# Patient Record
Sex: Female | Born: 1970 | Race: White | Hispanic: No | Marital: Married | Smoking: Never smoker
Health system: Southern US, Community
[De-identification: ages and names within clinical notes are randomized; demographics above are authoritative.]

## PROBLEM LIST (undated history)

## (undated) DIAGNOSIS — N200 Calculus of kidney: Secondary | ICD-10-CM

## (undated) DIAGNOSIS — B019 Varicella without complication: Secondary | ICD-10-CM

## (undated) HISTORY — DX: Calculus of kidney: N20.0

## (undated) HISTORY — PX: ABDOMINAL HYSTERECTOMY: SHX81

## (undated) HISTORY — DX: Varicella without complication: B01.9

---

## 2004-06-05 ENCOUNTER — Ambulatory Visit: Payer: Self-pay | Admitting: Unknown Physician Specialty

## 2004-06-14 ENCOUNTER — Ambulatory Visit: Payer: Self-pay | Admitting: Unknown Physician Specialty

## 2005-06-25 ENCOUNTER — Ambulatory Visit: Payer: Self-pay | Admitting: Unknown Physician Specialty

## 2006-07-15 ENCOUNTER — Ambulatory Visit: Payer: Self-pay | Admitting: Unknown Physician Specialty

## 2008-01-06 ENCOUNTER — Ambulatory Visit: Payer: Self-pay | Admitting: Urology

## 2008-05-03 ENCOUNTER — Ambulatory Visit: Payer: Self-pay | Admitting: Unknown Physician Specialty

## 2008-05-04 ENCOUNTER — Inpatient Hospital Stay: Payer: Self-pay | Admitting: Unknown Physician Specialty

## 2008-07-18 ENCOUNTER — Ambulatory Visit: Payer: Self-pay | Admitting: Unknown Physician Specialty

## 2009-08-30 ENCOUNTER — Ambulatory Visit: Payer: Self-pay | Admitting: Unknown Physician Specialty

## 2010-08-20 ENCOUNTER — Ambulatory Visit: Payer: Self-pay

## 2010-08-29 ENCOUNTER — Inpatient Hospital Stay: Payer: Self-pay

## 2010-08-30 LAB — PATHOLOGY REPORT

## 2010-10-08 ENCOUNTER — Ambulatory Visit: Payer: Self-pay | Admitting: Unknown Physician Specialty

## 2012-03-16 ENCOUNTER — Ambulatory Visit: Payer: Self-pay | Admitting: Family Medicine

## 2012-03-23 ENCOUNTER — Emergency Department: Payer: Self-pay | Admitting: Emergency Medicine

## 2012-03-23 LAB — URINALYSIS, COMPLETE
Bacteria: NONE SEEN
Leukocyte Esterase: NEGATIVE
Nitrite: NEGATIVE
Ph: 6 (ref 4.5–8.0)
Protein: NEGATIVE
RBC,UR: 5 /HPF (ref 0–5)
Squamous Epithelial: 4
WBC UR: NONE SEEN /HPF (ref 0–5)

## 2012-03-23 LAB — CBC
MCH: 32.6 pg (ref 26.0–34.0)
MCV: 92 fL (ref 80–100)
Platelet: 181 10*3/uL (ref 150–440)
RDW: 12.7 % (ref 11.5–14.5)
WBC: 8.3 10*3/uL (ref 3.6–11.0)

## 2012-03-23 LAB — BASIC METABOLIC PANEL
Calcium, Total: 9.1 mg/dL (ref 8.5–10.1)
Chloride: 107 mmol/L (ref 98–107)
Co2: 27 mmol/L (ref 21–32)
Creatinine: 0.7 mg/dL (ref 0.60–1.30)
EGFR (African American): >60
EGFR (Non-African Amer.): >60
Glucose: 93 mg/dL (ref 65–99)
Osmolality: 278 (ref 275–301)
Sodium: 140 mmol/L (ref 136–145)

## 2016-04-09 ENCOUNTER — Encounter: Payer: Self-pay | Admitting: Family

## 2016-04-09 ENCOUNTER — Ambulatory Visit (INDEPENDENT_AMBULATORY_CARE_PROVIDER_SITE_OTHER): Payer: 59 | Admitting: Family

## 2016-04-09 VITALS — BP 116/72 | HR 69 | Temp 98.4°F | Ht 63.5 in | Wt 218.4 lb

## 2016-04-09 DIAGNOSIS — Z0000 Encounter for general adult medical examination without abnormal findings: Secondary | ICD-10-CM | POA: Insufficient documentation

## 2016-04-09 DIAGNOSIS — Z7689 Persons encountering health services in other specified circumstances: Secondary | ICD-10-CM

## 2016-04-09 NOTE — Assessment & Plan Note (Addendum)
Reviewed past medical and social, family history with patient. We discussed her recent lab work which showed elevated cholesterol and LDL. Her risk is less than 7.5% for the next 10 years and we discussed the importance of lifestyle modifications. Diet information given including low glycemic index and dr. Lupita Dawn diet plan. Encouraged to use Weight Watchers at work. Patient will follow-up with CPE. Ordered TSH, vitamin D as it as those labs were not included with wellness labs by employer.

## 2016-04-09 NOTE — Progress Notes (Signed)
Pre visit review using our clinic review tool, if applicable. No additional management support is needed unless otherwise documented below in the visit note. 

## 2016-04-09 NOTE — Patient Instructions (Addendum)
Your cholesterol and triglycerides have increased since last check. Your risk for cardiovascular disease is < 7.5% over the next 10 years so we won't start medication therapy quite yet. Please follow low trans fat and low saturated fat diet. Maintain exercise 30 minutes per day , 5 times per week and we should be able to turn this around. We will follow this and discuss medication at your next visit.   Pleasure meeting you.    High Cholesterol High cholesterol refers to having a high level of cholesterol in your blood. Cholesterol is a white, waxy, fat-like protein that your body needs in small amounts. Your liver makes all the cholesterol you need. Excess cholesterol comes from the food you eat. Cholesterol travels in your bloodstream through your blood vessels. If you have high cholesterol, deposits (plaque) may build up on the walls of your blood vessels. This makes the arteries narrower and stiffer. Plaque increases your risk of heart attack and stroke. Work with your health care provider to keep your cholesterol levels in a healthy range. RISK FACTORS Several things can make you more likely to have high cholesterol. These include:   Eating foods high in animal fat (saturated fat) or cholesterol.  Being overweight.  Not getting enough exercise.  Having a family history of high cholesterol. SIGNS AND SYMPTOMS High cholesterol does not cause symptoms. DIAGNOSIS  Your health care provider can do a blood test to check whether you have high cholesterol. If you are older than 20, your health care provider may check your cholesterol every 4-6 years. You may be checked more often if you already have high cholesterol or other risk factors for heart disease. The blood test for cholesterol measures the following:  Bad cholesterol (LDL cholesterol). This is the type of cholesterol that causes heart disease. This number should be less than 100.  Good cholesterol (HDL cholesterol). This type helps  protect against heart disease. A healthy level of HDL cholesterol is 60 or higher.  Total cholesterol. This is the combined number of LDL cholesterol and HDL cholesterol. A healthy number is less than 200. TREATMENT  High cholesterol can be treated with diet changes, lifestyle changes, and medicine.   Diet changes may include eating more whole grains, fruits, vegetables, nuts, and fish. You may also have to cut back on red meat and foods with a lot of added sugar.  Lifestyle changes may include getting at least 40 minutes of aerobic exercise three times a week. Aerobic exercises include walking, biking, and swimming. Aerobic exercise along with a healthy diet can help you maintain a healthy weight. Lifestyle changes may also include quitting smoking.  If diet and lifestyle changes are not enough to lower your cholesterol, your health care provider may prescribe a statin medicine. This medicine has been shown to lower cholesterol and also lower the risk of heart disease. HOME CARE INSTRUCTIONS  Only take over-the-counter or prescription medicines as directed by your health care provider.   Follow a healthy diet as directed by your health care provider. For instance:   Eat chicken (without skin), fish, veal, shellfish, ground [place] breast, and round or loin cuts of red meat.  Do not eat fried foods and fatty meats, such as hot dogs and salami.   Eat plenty of fruits, such as apples.   Eat plenty of vegetables, such as broccoli, potatoes, and carrots.   Eat beans, peas, and lentils.   Eat grains, such as barley, rice, couscous, and bulgur wheat.  Eat pasta without cream sauces.   Use skim or nonfat milk and low-fat or nonfat yogurt and cheeses. Do not eat or drink whole milk, cream, ice cream, egg yolks, and hard cheeses.   Do not eat stick margarine or tub margarines that contain trans fats (also called partially hydrogenated oils).   Do not eat cakes, cookies, crackers,  or other baked goods that contain trans fats.   Do not eat saturated tropical oils, such as coconut and palm oil.   Exercise as directed by your health care provider. Increase your activity level with activities such as gardening or walking.   Keep all follow-up appointments.  SEEK MEDICAL CARE IF:  You are struggling to maintain a healthy diet or weight.  You need help starting an exercise program.  You need help to stop smoking. SEEK IMMEDIATE MEDICAL CARE IF:  You have chest pain.  You have trouble breathing.   This information is not intended to replace advice given to you by your health care provider. Make sure you discuss any questions you have with your health care provider.   Document Released: 06/23/2005 Document Revised: 07/14/2014 Document Reviewed: 04/15/2013 Elsevier Interactive Patient Education [employer].

## 2016-04-09 NOTE — Progress Notes (Signed)
Subjective:    Patient ID: Tracy Jacobs, female    DOB: August 04, 1970, 45 y.o.   MRN: [id number]  CC: JEZLYN BEUCLER is a 45 y.o. female who presents todayTo establish care.  HPI: Patient here today to establish care as a new patient to our practice. Prior care had been with [facility] in [place].  Her prior OB/GYN care had been at [facility]. Requesting those records.  Feeling well today no complaints.  Patient has a sister who passed away from breast cancer diagnosed at age 46. We discussed implications for her young daughters and advised her to discuss with her providers if they should start mammogram due to a maternal aunt in their 63s as well.        HISTORY:  Past Medical History:  Diagnosis Date  . Chicken pox   . Kidney stones    Past Surgical History:  Procedure Laterality Date  . ABDOMINAL HYSTERECTOMY    . CESAREAN SECTION     3 times   Family History  Problem Relation Age of Onset  . Breast cancer Sister 99  . Alcohol abuse Mother   . Alcohol abuse Father     Allergies: Review of patient's allergies indicates not on file. No current outpatient prescriptions on file prior to visit.   No current facility-administered medications on file prior to visit.     Social History  Substance Use Topics  . Smoking status: Never Smoker  . Smokeless tobacco: Never Used  . Alcohol use No    Review of Systems  Constitutional: Negative for chills, fever and unexpected weight change.  HENT: Negative for congestion.   Respiratory: Negative for cough.   Cardiovascular: Negative for chest pain, palpitations and leg swelling.  Gastrointestinal: Negative for nausea and vomiting.  Musculoskeletal: Negative for arthralgias and myalgias.  Skin: Negative for rash.  Neurological: Negative for headaches.  Hematological: Negative for adenopathy.  Psychiatric/Behavioral: Negative for confusion.      Objective:    BP 116/72   Pulse 69   Temp 98.4 F (36.9 C)  (Oral)   Ht 5' 3.5" (1.613 m)   Wt 218 lb 6.4 oz (99.1 kg)   SpO2 97%   BMI 38.08 kg/m  BP Readings from Last 3 Encounters:  04/09/16 116/72   Wt Readings from Last 3 Encounters:  04/09/16 218 lb 6.4 oz (99.1 kg)    Physical Exam  Constitutional: She appears well-developed and well-nourished.  Eyes: Conjunctivae are normal.  Neck: No thyroid mass and no thyromegaly present.  Cardiovascular: Normal rate, regular rhythm, normal heart sounds and normal pulses.   Pulmonary/Chest: Effort normal and breath sounds normal. She has no wheezes. She has no rhonchi. She has no rales.  Lymphadenopathy:       Head (right side): No submental, no submandibular, no tonsillar, no preauricular, no posterior auricular and no occipital adenopathy present.       Head (left side): No submental, no submandibular, no tonsillar, no preauricular, no posterior auricular and no occipital adenopathy present.    She has no cervical adenopathy.  Neurological: She is alert.  Skin: Skin is warm and dry.  Psychiatric: She has a normal mood and affect. Her speech is normal and behavior is normal. Thought content normal.  Vitals reviewed.       Assessment & Plan:   Problem List Items Addressed This Visit      Other   Encounter to establish care - Primary    Reviewed past medical  and social, family history with patient. We discussed her recent lab work which showed elevated cholesterol and LDL. Her risk is less than 7.5% for the next 10 years and we discussed the importance of lifestyle modifications. Diet information given including low glycemic index and dr. Lupita Dawn diet plan. Encouraged to use Weight Watchers at work. Patient will follow-up with CPE. Ordered TSH, vitamin D as it as those labs were not included with wellness labs by employer.      Relevant Orders   MM DIGITAL SCREENING BILATERAL   TSH   VITAMIN D 25 Hydroxy (Vit-D Deficiency, Fractures)    Other Visit Diagnoses   None.      Ms.  Klopp does not currently have medications on file.   No orders of the defined types were placed in this encounter.   Return precautions given.   Risks, benefits, and alternatives of the medications and treatment plan prescribed today were discussed, and patient expressed understanding.   Education regarding symptom management and diagnosis given to patient on AVS.  Continue to follow with Mable Paris, FNP for routine health maintenance.   Tracy Jacobs and I agreed with plan.   Mable Paris, FNP

## 2016-04-10 ENCOUNTER — Encounter: Payer: Self-pay | Admitting: *Deleted

## 2016-04-10 LAB — HIV ANTIBODY (ROUTINE TESTING W REFLEX): HIV Screen 4th Generation wRfx: NONREACTIVE

## 2016-04-10 LAB — TSH: TSH: 1.63 u[IU]/mL (ref 0.450–4.500)

## 2016-04-10 LAB — VITAMIN D 25 HYDROXY (VIT D DEFICIENCY, FRACTURES): Vit D, 25-Hydroxy: 20.3 ng/mL — ABNORMAL LOW (ref 30.0–100.0)

## 2016-04-15 ENCOUNTER — Encounter: Payer: Self-pay | Admitting: Family

## 2016-05-12 ENCOUNTER — Other Ambulatory Visit: Payer: Self-pay | Admitting: Family

## 2016-05-12 ENCOUNTER — Ambulatory Visit
Admission: RE | Admit: 2016-05-12 | Discharge: 2016-05-12 | Disposition: A | Discharge status: Home / Self Care | Payer: 59 | Source: Ambulatory Visit | Attending: Family | Admitting: Family

## 2016-05-12 DIAGNOSIS — Z7689 Persons encountering health services in other specified circumstances: Secondary | ICD-10-CM

## 2016-05-12 DIAGNOSIS — Z1231 Encounter for screening mammogram for malignant neoplasm of breast: Secondary | ICD-10-CM | POA: Insufficient documentation

## 2016-06-16 ENCOUNTER — Encounter: Payer: Self-pay | Admitting: Family

## 2016-06-16 ENCOUNTER — Ambulatory Visit (INDEPENDENT_AMBULATORY_CARE_PROVIDER_SITE_OTHER): Payer: 59 | Admitting: Family

## 2016-06-16 VITALS — BP 110/76 | HR 60 | Temp 98.5°F | Ht 64.0 in | Wt 218.2 lb

## 2016-06-16 DIAGNOSIS — M25521 Pain in right elbow: Secondary | ICD-10-CM | POA: Diagnosis not present

## 2016-06-16 DIAGNOSIS — Z0001 Encounter for general adult medical examination with abnormal findings: Secondary | ICD-10-CM | POA: Diagnosis not present

## 2016-06-16 DIAGNOSIS — Z0000 Encounter for general adult medical examination without abnormal findings: Secondary | ICD-10-CM

## 2016-06-16 NOTE — Patient Instructions (Addendum)
Tetanus vaccine, Tdap, at local pharmacy.   Suspect lateral epicondylitis. Advised brace. Alt heat and ice.   Let me know if not better  Referral to derm   [facility] Ask your health care provider which exercises are safe for you. Do exercises exactly as told by your health care provider and adjust them as directed. It is normal to feel mild stretching, pulling, tightness, or discomfort as you do these exercises, but you should stop right away if you feel sudden pain or your pain gets worse. Do not begin these exercises until told by your health care provider. Stretching and range of motion exercises These exercises warm up your muscles and joints and improve the movement and flexibility of your elbow. These exercises also help to relieve pain, numbness, and tingling. Exercise A: Wrist extensor stretch 1. Extend your left / right elbow with your fingers pointing down. 2. Gently pull the palm of your left / right hand toward you until you feel a gentle stretch on the top of your forearm. 3. To increase the stretch, push your left / right hand toward the outer edge or pinkie side of your forearm. 4. Hold this position for __________ seconds. Repeat __________ times. Complete this exercise __________ times a day. If directed by your health care provider, repeat this stretch except do it with a bent elbow this time. Exercise B: Wrist flexor stretch 1. Extend your left / right elbow and turn your palm upward. 2. Gently pull your left / right palm and fingertips back so your wrist extends and your fingers point more toward the ground. 3. You should feel a gentle stretch on the inside of your forearm. 4. Hold this position for __________ seconds. Repeat __________ times. Complete this exercise __________ times a day. If directed by your health care provider, repeat this stretch except do it with a bent elbow this time. Strengthening exercises These exercises build strength and endurance  in your elbow. Endurance is the ability to use your muscles for a long time, even after they get tired. Exercise C: Wrist extensors 1. Sit with your left / right forearm palm-down and fully supported on a table or countertop. Your elbow should be resting below the height of your shoulder. 2. Let your left / right wrist extend over the edge of the surface. 3. Loosely hold a __________ weight or a piece of rubber exercise band or tubing in your left / right hand. Slowly curl your left / right hand up toward your forearm. If you are using band or tubing, hold the band or tubing in place with your other hand to provide resistance. 4. Hold this position for __________ seconds. 5. Slowly return to the starting position. Repeat __________ times. Complete this exercise __________ times a day. Exercise D: Radial deviators 1. Stand with a __________ weight in your left / righthand. Or, sit while holding a rubber exercise band or tubing with your other arm supported on a table or countertop. Position your hand so your thumb is on top. 2. Raise your hand upward in front of you so your thumb travels toward your forearm, or pull up on the rubber tubing. 3. Hold this position for __________ seconds. 4. Slowly return to the starting position. Repeat __________ times. Complete this exercise __________ times a day. Exercise E: Eccentric wrist extensors 1. Sit with your left / right forearm palm-down and fully supported on a table or countertop. Your elbow should be resting below the height of your  shoulder. 2. If told by your health care provider, hold a __________ weight in your hand. 3. Let your left / right wrist extend over the edge of the surface. 4. Use your other hand to lift up your left / right hand toward your forearm. Keep your forearm on the table. 5. Using only the muscles in your left / right hand, slowly lower your hand back down to the starting position. Repeat __________ times. Complete this  exercise __________ times a day. This information is not intended to replace advice given to you by your health care provider. Make sure you discuss any questions you have with your health care provider. Document Released: 06/23/2005 Document Revised: 02/27/2016 Document Reviewed: 03/22/2015 Elsevier Interactive Patient Education  2017 [employer].

## 2016-06-16 NOTE — Progress Notes (Signed)
Subjective:    Patient ID: Tracy Jacobs, female    DOB: 18-May-1971, 46 y.o.   MRN: [id number]  CC: Tracy Jacobs is a 45 y.o. female who presents today for physical exam.    HPI: Feeling well.   Complains of right elbow pain for one month, waxing and waning. Tried ibuprofen with some relief.Endorses numbness from elbow to wrist, doesn't go to fingers.No injury. 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 Righthanded.       Colorectal Cancer Screening: No early family history Breast Cancer Screening: Mammogram UTD Cervical Cancer Screening: h/o hysterectomy       Tetanus - Due       Labs: Done prior; in addition to wellness labs done at work. Exercise: Gets regular exercise.  Alcohol use: None Smoking/tobacco use: Nonsmoker.  Regular dental exams:UTD Wears seat belt: Yes. Skin: No concerning lesions. Fair skin.  HISTORY:  Past Medical History:  Diagnosis Date  . Chicken pox   . Kidney stones     Past Surgical History:  Procedure Laterality Date  . ABDOMINAL HYSTERECTOMY    . CESAREAN SECTION     3 times   Family History  Problem Relation Age of Onset  . Breast cancer Sister 81  . Alcohol abuse Mother   . Alcohol abuse Father       ALLERGIES: Patient has no allergy information on record.  No current outpatient prescriptions on file prior to visit.   No current facility-administered medications on file prior to visit.     Social History  Substance Use Topics  . Smoking status: Never Smoker  . Smokeless tobacco: Never Used  . Alcohol use No    Review of Systems  Constitutional: Negative for chills, fever and unexpected weight change.  HENT: Negative for congestion.   Respiratory: Negative for cough.   Cardiovascular: Negative for chest pain, palpitations and leg swelling.  Gastrointestinal: Negative for nausea and vomiting.  Musculoskeletal: Negative for arthralgias and myalgias.  Skin: Negative for rash.  Neurological: Negative  for headaches.  Hematological: Negative for adenopathy.  Psychiatric/Behavioral: Negative for confusion.      Objective:    BP 110/76   Pulse 60   Temp 98.5 F (36.9 C) (Oral)   Ht 5\' 4"  (1.626 m)   Wt 218 lb 3.2 oz (99 kg)   SpO2 98%   BMI 37.45 kg/m   BP Readings from Last 3 Encounters:  06/16/16 110/76  04/09/16 116/72   Wt Readings from Last 3 Encounters:  06/16/16 218 lb 3.2 oz (99 kg)  04/09/16 218 lb 6.4 oz (99.1 kg)    Physical Exam  Constitutional: She appears well-developed and well-nourished.  Eyes: Conjunctivae are normal.  Neck: No thyroid mass and no thyromegaly present.  Cardiovascular: Normal rate, regular rhythm, normal heart sounds and normal pulses.   Pulmonary/Chest: Effort normal and breath sounds normal. She has no wheezes. She has no rhonchi. She has no rales. Right breast exhibits no inverted nipple, no mass, no nipple discharge, no skin change and no tenderness. Left breast exhibits no inverted nipple, no mass, no nipple discharge, no skin change and no tenderness. Breasts are symmetrical.  CBE performed.   Musculoskeletal:       Right elbow: She exhibits normal range of motion, no swelling and no effusion. Tenderness found. Lateral epicondyle tenderness noted.  Right elbow pain with resisted wrist extension  Lymphadenopathy:       Head (right side): No submental, no submandibular, no  tonsillar, no preauricular, no posterior auricular and no occipital adenopathy present.       Head (left side): No submental, no submandibular, no tonsillar, no preauricular, no posterior auricular and no occipital adenopathy present.    She has no cervical adenopathy.       Right cervical: No superficial cervical, no deep cervical and no posterior cervical adenopathy present.      Left cervical: No superficial cervical, no deep cervical and no posterior cervical adenopathy present.    She has no axillary adenopathy.  Neurological: She is alert. She has normal  strength.  BUE sensation intact. Negative phalen and tinel.    Skin: Skin is warm and dry.  Psychiatric: She has a normal mood and affect. Her speech is normal and behavior is normal. Thought content normal.  Vitals reviewed.      Assessment & Plan:   Problem List Items Addressed This Visit      Other   Routine physical examination - Primary    Patient has a history of hysterectomy. Has had no abnormal Paps or history of GYN cancer. Based on age and preference, patient prefers not to do Pap smear screening any longer. CBE done today. Mammogram is up-to-date.  advised tetanus done at local pharmacy due to cost. Screening labs up-to-date. Encouraged exercise. Referral to derm for annual skin check.       Relevant Orders   Ambulatory referral to Dermatology   Right elbow pain    Symptoms consistent with lateral epicondylitis. Patient and I agreed on conservative management. Home exercises and education given.          Ms. Barroga does not currently have medications on file.   No orders of the defined types were placed in this encounter.   Return precautions given.   Risks, benefits, and alternatives of the medications and treatment plan prescribed today were discussed, and patient expressed understanding.   Education regarding symptom management and diagnosis given to patient on AVS.   Continue to follow with Mable Paris, FNP for routine health maintenance.   Tracy Jacobs and I agreed with plan.   Mable Paris, FNP

## 2016-06-16 NOTE — Progress Notes (Signed)
Pre visit review using our clinic review tool, if applicable. No additional management support is needed unless otherwise documented below in the visit note. 

## 2016-06-16 NOTE — Assessment & Plan Note (Signed)
Symptoms consistent with lateral epicondylitis. Patient and I agreed on conservative management. Home exercises and education given.

## 2016-06-16 NOTE — Assessment & Plan Note (Addendum)
Patient has a history of hysterectomy. Has had no abnormal Paps or history of GYN cancer. Based on age and preference, patient prefers not to do Pap smear screening any longer. CBE done today. Mammogram is up-to-date.  advised tetanus done at local pharmacy due to cost. Screening labs up-to-date. Encouraged exercise. Referral to derm for annual skin check.

## 2017-04-03 ENCOUNTER — Other Ambulatory Visit: Payer: Self-pay | Admitting: Family

## 2017-04-03 DIAGNOSIS — Z1231 Encounter for screening mammogram for malignant neoplasm of breast: Secondary | ICD-10-CM

## 2017-04-13 ENCOUNTER — Ambulatory Visit (INDEPENDENT_AMBULATORY_CARE_PROVIDER_SITE_OTHER): Payer: 59 | Admitting: Family

## 2017-04-13 ENCOUNTER — Encounter: Payer: Self-pay | Admitting: Family

## 2017-04-13 VITALS — BP 108/68 | HR 61 | Temp 98.3°F | Ht 64.0 in | Wt 225.0 lb

## 2017-04-13 DIAGNOSIS — R0683 Snoring: Secondary | ICD-10-CM | POA: Diagnosis not present

## 2017-04-13 DIAGNOSIS — E669 Obesity, unspecified: Secondary | ICD-10-CM

## 2017-04-13 NOTE — Progress Notes (Signed)
Subjective:    Patient ID: Tracy Jacobs, female    DOB: 1971/02/22, 46 y.o.   MRN: [id number]  CC: Tracy Jacobs is a 46 y.o. female who presents today for follow up.   HPI: Here for BMI paperwork to filled out for work. Would like to start weight watchers. Not excercising currently.   Recent cholesterol done at work as well.   Also notes she snores. 'Some' fatigue.NO frequent headache.     HISTORY:  Past Medical History:  Diagnosis Date  . Chicken pox   . Kidney stones    Past Surgical History:  Procedure Laterality Date  . ABDOMINAL HYSTERECTOMY    . CESAREAN SECTION     3 times   Family History  Problem Relation Age of Onset  . Breast cancer Sister 26  . Alcohol abuse Mother   . Alcohol abuse Father     Allergies: Pollen extract No current outpatient prescriptions on file prior to visit.   No current facility-administered medications on file prior to visit.     Social History  Substance Use Topics  . Smoking status: Never Smoker  . Smokeless tobacco: Never Used  . Alcohol use No    Review of Systems  Constitutional: Positive for fatigue. Negative for chills and fever.  Respiratory: Negative for cough.   Cardiovascular: Negative for chest pain and palpitations.  Gastrointestinal: Negative for nausea and vomiting.      Objective:    BP 108/68   Pulse 61   Temp 98.3 F (36.8 C) (Oral)   Ht 5\' 4"  (1.626 m)   Wt 225 lb (102.1 kg)   SpO2 95%   BMI 38.62 kg/m  BP Readings from Last 3 Encounters:  04/13/17 108/68  06/16/16 110/76  04/09/16 116/72   Wt Readings from Last 3 Encounters:  04/13/17 225 lb (102.1 kg)  06/16/16 218 lb 3.2 oz (99 kg)  04/09/16 218 lb 6.4 oz (99.1 kg)    Physical Exam  Constitutional: She appears well-developed and well-nourished.  Eyes: Conjunctivae are normal.  Cardiovascular: Normal rate, regular rhythm, normal heart sounds and normal pulses.   Pulmonary/Chest: Effort normal and breath sounds normal. She  has no wheezes. She has no rhonchi. She has no rales.  Neurological: She is alert.  Skin: Skin is warm and dry.  Psychiatric: She has a normal mood and affect. Her speech is normal and behavior is normal. Thought content normal.  Vitals reviewed.      Assessment & Plan:   Problem List Items Addressed This Visit      Other   Obesity    Completed form for patient; discussed barriers and lifestyle measures. Patient willing to start weight watchers      Snores - Primary    pending sleep study. Suspect no exercise contributory to fatigue. If unrevealing, advised patient to make f/u to discuss fatigue.       Relevant Orders   Ambulatory referral to Sleep Studies       I am having Ms. Rikard maintain her meloxicam.   Meds ordered this encounter  Medications  . meloxicam (MOBIC) 15 MG tablet    Sig: meloxicam 15 mg tablet  take 1 tablet by mouth once daily with meals    Return precautions given.   Risks, benefits, and alternatives of the medications and treatment plan prescribed today were discussed, and patient expressed understanding.   Education regarding symptom management and diagnosis given to patient on AVS.  Continue to follow with  Burnard Hawthorne, FNP for routine health maintenance.   Tracy Jacobs and I agreed with plan.   Mable Paris, FNP

## 2017-04-13 NOTE — Progress Notes (Signed)
Pre visit review using our clinic review tool, if applicable. No additional management support is needed unless otherwise documented below in the visit note. 

## 2017-04-13 NOTE — Patient Instructions (Signed)
Pleasure seeing you  Good luck with weight watchers!

## 2017-04-14 DIAGNOSIS — G4733 Obstructive sleep apnea (adult) (pediatric): Secondary | ICD-10-CM | POA: Insufficient documentation

## 2017-04-14 DIAGNOSIS — E669 Obesity, unspecified: Secondary | ICD-10-CM | POA: Insufficient documentation

## 2017-04-14 NOTE — Assessment & Plan Note (Addendum)
pending sleep study. Suspect no exercise contributory to fatigue. If unrevealing, advised patient to make f/u to discuss fatigue.

## 2017-04-14 NOTE — Assessment & Plan Note (Signed)
Completed form for patient; discussed barriers and lifestyle measures. Patient willing to start weight watchers

## 2017-04-29 ENCOUNTER — Encounter: Payer: Self-pay | Admitting: Family

## 2017-05-06 ENCOUNTER — Telehealth: Payer: Self-pay | Admitting: Family

## 2017-05-06 DIAGNOSIS — G4733 Obstructive sleep apnea (adult) (pediatric): Secondary | ICD-10-CM

## 2017-05-06 NOTE — Telephone Encounter (Signed)
Call pt  Sleep study shows osa  Ordered cipap

## 2017-05-06 NOTE — Telephone Encounter (Signed)
Left message for patient to return call back.  

## 2017-05-07 NOTE — Telephone Encounter (Signed)
Patient was informed of results.  Patient understood and no questions, comments, or concerns at this time.  

## 2017-05-13 ENCOUNTER — Ambulatory Visit
Admission: RE | Admit: 2017-05-13 | Discharge: 2017-05-13 | Disposition: A | Discharge status: Home / Self Care | Payer: 59 | Source: Ambulatory Visit | Attending: Family | Admitting: Family

## 2017-05-13 DIAGNOSIS — Z1231 Encounter for screening mammogram for malignant neoplasm of breast: Secondary | ICD-10-CM | POA: Insufficient documentation

## 2017-05-20 ENCOUNTER — Telehealth: Payer: Self-pay

## 2017-05-20 NOTE — Telephone Encounter (Signed)
Pt. Called to report her new CPAP face mask is uncomfortable. Did not sleep well the past two nights.Wants to know if smaller mask would be possible. Reports Huey Romans brought her machine to the home. Instructed pt. To call them with help for a mask that will be more comfortable for her. Verbalizes understanding.

## 2017-06-17 ENCOUNTER — Encounter: Payer: 59 | Admitting: Family

## 2017-09-07 ENCOUNTER — Ambulatory Visit (INDEPENDENT_AMBULATORY_CARE_PROVIDER_SITE_OTHER): Payer: Managed Care, Other (non HMO) | Admitting: Family

## 2017-09-07 ENCOUNTER — Encounter: Payer: Self-pay | Admitting: Family

## 2017-09-07 DIAGNOSIS — Z0000 Encounter for general adult medical examination without abnormal findings: Secondary | ICD-10-CM

## 2017-09-07 NOTE — Assessment & Plan Note (Addendum)
Clinical breast exam performed.  Unable to appreciate any masses in right breast as patient appreciated this morning.  I had a long discussion with her and my recommendation was to pursue diagnostic mammogram.  I do not feel comfortable with basing decisions such as this on my breast exam today, especially as she has dense breasts.  Patient was very polite however she declined diagnostic mammogram at this time as she would prefer to monitor and see if she could appreciate the mass again.  She states she will let me know.  In the absence of any pelvic complaints, we jointly agreed not to perform a pelvic exam.  She no longer has Pap smear in the history of hysterectomy and per pathology, no cervix, which she felt comfortable with.  Screening labs will be done at her work in the fall.  I advised her to have a Tdap done.  Encouraged her to start a walking program.

## 2017-09-07 NOTE — Progress Notes (Signed)
Subjective:    Patient ID: Tracy Jacobs, female    DOB: 10/30/1970, 47 y.o.   MRN: [id number]  CC: Tracy Jacobs is a 47 y.o. female who presents today for physical exam.    HPI: OSA- on cipap since 05/2018. Compliant for 4-5 hours per night, every night. Fatigue has improved.   'thought I felt' a lump this morning in right breast, inner side. Non tender. Hasnt felt before    Colorectal Cancer Screening: no early family history Breast Cancer Screening: Mammogram UTD Cervical Cancer Screening: h/o hysterectomy and no cervix per pathology report 2012 with prior PCP.  Bone Health screening/DEXA for 65+: No increased fracture risk. Defer screening at this time. Lung Cancer Screening: Doesn't have 30 year pack year history and age > 17 years.       Tetanus - thinks about 9 years ago.          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  Exercise: No regular exercise.  Alcohol use: none Smoking/tobacco use: Nonsmoker.  Regular dental exams: Due Wears seat belt: Yes. Skin: no new lesions; [facility] in past  HISTORY:  Past Medical History:  Diagnosis Date  . Chicken pox   . Kidney stones     Past Surgical History:  Procedure Laterality Date  . ABDOMINAL HYSTERECTOMY    . CESAREAN SECTION     3 times   Family History  Problem Relation Age of Onset  . Breast cancer Sister 63  . Alcohol abuse Mother   . Alcohol abuse Father   . Colon cancer Neg Hx       ALLERGIES: Pollen extract  Current Outpatient Medications on File Prior to Visit  Medication Sig Dispense Refill  . meloxicam (MOBIC) 15 MG tablet meloxicam 15 mg tablet  take 1 tablet by mouth once daily with meals     No current facility-administered medications on file prior to visit.     Social History   Tobacco Use  . Smoking status: Never Smoker  . Smokeless tobacco: Never Used  Substance Use Topics  . Alcohol use: No  . Drug use: No    Review of Systems  Constitutional:  Negative for chills, fever and unexpected weight change.  HENT: Negative for congestion.   Respiratory: Negative for cough.   Cardiovascular: Negative for chest pain, palpitations and leg swelling.  Gastrointestinal: Negative for nausea and vomiting.  Genitourinary: Negative for pelvic pain, vaginal bleeding and vaginal discharge.  Musculoskeletal: Negative for arthralgias and myalgias.  Skin: Negative for rash.  Neurological: Negative for headaches.  Hematological: Negative for adenopathy.  Psychiatric/Behavioral: Negative for confusion.      Objective:    BP 118/76 (BP Location: Left Arm, Patient Position: Sitting, Cuff Size: Normal)   Pulse (!) 48   Temp 98.4 F (36.9 C) (Oral)   Resp 16   Ht 5\' 4"  (1.626 m)   Wt 208 lb 4 oz (94.5 kg)   SpO2 98%   BMI 35.75 kg/m   BP Readings from Last 3 Encounters:  09/07/17 118/76  04/13/17 108/68  06/16/16 110/76   Wt Readings from Last 3 Encounters:  09/07/17 208 lb 4 oz (94.5 kg)  04/13/17 225 lb (102.1 kg)  06/16/16 218 lb 3.2 oz (99 kg)    Physical Exam  Constitutional: She appears well-developed and well-nourished.  Eyes: Conjunctivae are normal.  Neck: No thyroid mass and no thyromegaly present.  Cardiovascular: Normal rate, regular rhythm, normal heart sounds and normal  pulses.  Pulmonary/Chest: Effort normal and breath sounds normal. She has no wheezes. She has no rhonchi. She has no rales. Right breast exhibits no inverted nipple, no mass, no nipple discharge, no skin change and no tenderness. Left breast exhibits no inverted nipple, no mass, no nipple discharge, no skin change and no tenderness. Breasts are symmetrical.  CBE performed. No masses appreciated. Dense breasts.   Lymphadenopathy:       Head (right side): No submental, no submandibular, no tonsillar, no preauricular, no posterior auricular and no occipital adenopathy present.       Head (left side): No submental, no submandibular, no tonsillar, no preauricular,  no posterior auricular and no occipital adenopathy present.    She has no cervical adenopathy.       Right cervical: No superficial cervical, no deep cervical and no posterior cervical adenopathy present.      Left cervical: No superficial cervical, no deep cervical and no posterior cervical adenopathy present.    She has no axillary adenopathy.  Neurological: She is alert.  Skin: Skin is warm and dry.  Psychiatric: She has a normal mood and affect. Her speech is normal and behavior is normal. Thought content normal.  Vitals reviewed.      Assessment & Plan:   Problem List Items Addressed This Visit      Other   Routine physical examination    Clinical breast exam performed.  Unable to appreciate any masses in right breast as patient appreciated this morning.  I had a long discussion with her and my recommendation was to pursue diagnostic mammogram.  I do not feel comfortable with basing decisions such as this on my breast exam today, especially as she has dense breasts.  Patient was very polite however she declined diagnostic mammogram at this time as she would prefer to monitor and see if she could appreciate the mass again.  She states she will let me know.  In the absence of any pelvic complaints, we jointly agreed not to perform a pelvic exam.  She no longer has Pap smear in the history of hysterectomy and per pathology, no cervix, which she felt comfortable with.  Screening labs will be done at her work in the fall.  I advised her to have a Tdap done.  Encouraged her to start a walking program.          I am having Tracy Jacobs maintain her meloxicam.   No orders of the defined types were placed in this encounter.   Return precautions given.   Risks, benefits, and alternatives of the medications and treatment plan prescribed today were discussed, and patient expressed understanding.   Education regarding symptom management and diagnosis given to patient on AVS.    Continue to follow with Burnard Hawthorne, FNP for routine health maintenance.   Tracy Jacobs and I agreed with plan.   Mable Paris, FNP

## 2017-09-07 NOTE — Patient Instructions (Addendum)
tdap at local pharmacy  Please follow up with dermatology for annual skin check.   Bring labs in the Fall once done.   Again, I would be most comfortable with ordering a diagnostic mammogram if you appreciated ANY mass in your right breast.   Please let me know if you would reconsider and I will order for you.   Health Maintenance, Female Adopting a healthy lifestyle and getting preventive care can go a long way to promote health and wellness. Talk with your health care provider about what schedule of regular examinations is right for you. This is a good chance for you to check in with your provider about disease prevention and staying healthy. In between checkups, there are plenty of things you can do on your own. Experts have done a lot of research about which lifestyle changes and preventive measures are most likely to keep you healthy. Ask your health care provider for more information. Weight and diet Eat a healthy diet  Be sure to include plenty of vegetables, fruits, low-fat dairy products, and lean protein.  Do not eat a lot of foods high in solid fats, added sugars, or salt.  Get regular exercise. This is one of the most important things you can do for your health. ? Most adults should exercise for at least 150 minutes each week. The exercise should increase your heart rate and make you sweat (moderate-intensity exercise). ? Most adults should also do strengthening exercises at least twice a week. This is in addition to the moderate-intensity exercise.  Maintain a healthy weight  Body mass index (BMI) is a measurement that can be used to identify possible weight problems. It estimates body fat based on height and weight. Your health care provider can help determine your BMI and help you achieve or maintain a healthy weight.  For females 3 years of age and older: ? A BMI below 18.5 is considered underweight. ? A BMI of 18.5 to 24.9 is normal. ? A BMI of 25 to 29.9 is  considered overweight. ? A BMI of 30 and above is considered obese.  Watch levels of cholesterol and blood lipids  You should start having your blood tested for lipids and cholesterol at 48 years of age, then have this test every 5 years.  You may need to have your cholesterol levels checked more often if: ? Your lipid or cholesterol levels are high. ? You are older than 48 years of age. ? You are at high risk for heart disease.  Cancer screening Lung Cancer  Lung cancer screening is recommended for adults 62-86 years old who are at high risk for lung cancer because of a history of smoking.  A yearly low-dose CT scan of the lungs is recommended for people who: ? Currently smoke. ? Have quit within the past 15 years. ? Have at least a 30-pack-year history of smoking. A pack year is smoking an average of one pack of cigarettes a day for 1 year.  Yearly screening should continue until it has been 15 years since you quit.  Yearly screening should stop if you develop a health problem that would prevent you from having lung cancer treatment.  Breast Cancer  Practice breast self-awareness. This means understanding how your breasts normally appear and feel.  It also means doing regular breast self-exams. Let your health care provider know about any changes, no matter how small.  If you are in your 20s or 30s, you should have a clinical breast exam (  CBE) by a health care provider every 1-3 years as part of a regular health exam.  If you are 72 or older, have a CBE every year. Also consider having a breast X-ray (mammogram) every year.  If you have a family history of breast cancer, talk to your health care provider about genetic screening.  If you are at high risk for breast cancer, talk to your health care provider about having an MRI and a mammogram every year.  Breast cancer gene (BRCA) assessment is recommended for women who have family members with BRCA-related cancers.  BRCA-related cancers include: ? Breast. ? Ovarian. ? Tubal. ? Peritoneal cancers.  Results of the assessment will determine the need for genetic counseling and BRCA1 and BRCA2 testing.  Cervical Cancer Your health care provider may recommend that you be screened regularly for cancer of the pelvic organs (ovaries, uterus, and vagina). This screening involves a pelvic examination, including checking for microscopic changes to the surface of your cervix (Pap test). You may be encouraged to have this screening done every 3 years, beginning at age 46.  For women ages 51-65, health care providers may recommend pelvic exams and Pap testing every 3 years, or they may recommend the Pap and pelvic exam, combined with testing for human papilloma virus (HPV), every 5 years. Some types of HPV increase your risk of cervical cancer. Testing for HPV may also be done on women of any age with unclear Pap test results.  Other health care providers may not recommend any screening for nonpregnant women who are considered low risk for pelvic cancer and who do not have symptoms. Ask your health care provider if a screening pelvic exam is right for you.  If you have had past treatment for cervical cancer or a condition that could lead to cancer, you need Pap tests and screening for cancer for at least 20 years after your treatment. If Pap tests have been discontinued, your risk factors (such as having a new sexual partner) need to be reassessed to determine if screening should resume. Some women have medical problems that increase the chance of getting cervical cancer. In these cases, your health care provider may recommend more frequent screening and Pap tests.  Colorectal Cancer  This type of cancer can be detected and often prevented.  Routine colorectal cancer screening usually begins at 48 years of age and continues through 48 years of age.  Your health care provider may recommend screening at an earlier age if  you have risk factors for colon cancer.  Your health care provider may also recommend using home test kits to check for hidden blood in the stool.  A small camera at the end of a tube can be used to examine your colon directly (sigmoidoscopy or colonoscopy). This is done to check for the earliest forms of colorectal cancer.  Routine screening usually begins at age 42.  Direct examination of the colon should be repeated every 5-10 years through 48 years of age. However, you may need to be screened more often if early forms of precancerous polyps or small growths are found.  Skin Cancer  Check your skin from head to toe regularly.  Tell your health care provider about any new moles or changes in moles, especially if there is a change in a mole's shape or color.  Also tell your health care provider if you have a mole that is larger than the size of a pencil eraser.  Always use sunscreen. Apply sunscreen liberally  and repeatedly throughout the day.  Protect yourself by wearing long sleeves, pants, a wide-brimmed hat, and sunglasses whenever you are outside.  Heart disease, diabetes, and high blood pressure  High blood pressure causes heart disease and increases the risk of stroke. High blood pressure is more likely to develop in: ? People who have blood pressure in the high end of the normal range (130-139/85-89 mm Hg). ? People who are overweight or obese. ? People who are African American.  If you are 8-42 years of age, have your blood pressure checked every 3-5 years. If you are 83 years of age or older, have your blood pressure checked every year. You should have your blood pressure measured twice-once when you are at a hospital or clinic, and once when you are not at a hospital or clinic. Record the average of the two measurements. To check your blood pressure when you are not at a hospital or clinic, you can use: ? An automated blood pressure machine at a pharmacy. ? A home blood  pressure monitor.  If you are between 25 years and 84 years old, ask your health care provider if you should take aspirin to prevent strokes.  Have regular diabetes screenings. This involves taking a blood sample to check your fasting blood sugar level. ? If you are at a normal weight and have a low risk for diabetes, have this test once every three years after 48 years of age. ? If you are overweight and have a high risk for diabetes, consider being tested at a younger age or more often. Preventing infection Hepatitis B  If you have a higher risk for hepatitis B, you should be screened for this virus. You are considered at high risk for hepatitis B if: ? You were born in a country where hepatitis B is common. Ask your health care provider which countries are considered high risk. ? Your parents were born in a high-risk country, and you have not been immunized against hepatitis B (hepatitis B vaccine). ? You have HIV or AIDS. ? You use needles to inject street drugs. ? You live with someone who has hepatitis B. ? You have had sex with someone who has hepatitis B. ? You get hemodialysis treatment. ? You take certain medicines for conditions, including cancer, organ transplantation, and autoimmune conditions.  Hepatitis C  Blood testing is recommended for: ? Everyone born from [birth year] through 1966. ? Anyone with known risk factors for hepatitis C.  Sexually transmitted infections (STIs)  You should be screened for sexually transmitted infections (STIs) including gonorrhea and chlamydia if: ? You are sexually active and are younger than 48 years of age. ? You are older than 48 years of age and your health care provider tells you that you are at risk for this type of infection. ? Your sexual activity has changed since you were last screened and you are at an increased risk for chlamydia or gonorrhea. Ask your health care provider if you are at risk.  If you do not have HIV, but are at risk,  it may be recommended that you take a prescription medicine daily to prevent HIV infection. This is called pre-exposure prophylaxis (PrEP). You are considered at risk if: ? You are sexually active and do not regularly use condoms or know the HIV status of your partner(s). ? You take drugs by injection. ? You are sexually active with a partner who has HIV.  Talk with your health care provider about whether  you are at high risk of being infected with HIV. If you choose to begin PrEP, you should first be tested for HIV. You should then be tested every 3 months for as long as you are taking PrEP. Pregnancy  If you are premenopausal and you may become pregnant, ask your health care provider about preconception counseling.  If you may become pregnant, take 400 to 800 micrograms (mcg) of folic acid every day.  If you want to prevent pregnancy, talk to your health care provider about birth control (contraception). Osteoporosis and menopause  Osteoporosis is a disease in which the bones lose minerals and strength with aging. This can result in serious bone fractures. Your risk for osteoporosis can be identified using a bone density scan.  If you are 35 years of age or older, or if you are at risk for osteoporosis and fractures, ask your health care provider if you should be screened.  Ask your health care provider whether you should take a calcium or vitamin D supplement to lower your risk for osteoporosis.  Menopause may have certain physical symptoms and risks.  Hormone replacement therapy may reduce some of these symptoms and risks. Talk to your health care provider about whether hormone replacement therapy is right for you. Follow these instructions at home:  Schedule regular health, dental, and eye exams.  Stay current with your immunizations.  Do not use any tobacco products including cigarettes, chewing tobacco, or electronic cigarettes.  If you are pregnant, do not drink  alcohol.  If you are breastfeeding, limit how much and how often you drink alcohol.  Limit alcohol intake to no more than 1 drink per day for nonpregnant women. One drink equals 12 ounces of beer, 5 ounces of wine, or 1 ounces of hard liquor.  Do not use street drugs.  Do not share needles.  Ask your health care provider for help if you need support or information about quitting drugs.  Tell your health care provider if you often feel depressed.  Tell your health care provider if you have ever been abused or do not feel safe at home. This information is not intended to replace advice given to you by your health care provider. Make sure you discuss any questions you have with your health care provider. Document Released: 01/06/2011 Document Revised: 11/29/2015 Document Reviewed: 03/27/2015 Elsevier Interactive Patient Education  [employer].

## 2017-11-23 ENCOUNTER — Encounter: Payer: Self-pay | Admitting: Family

## 2017-11-26 ENCOUNTER — Encounter: Payer: Self-pay | Admitting: Family

## 2017-11-26 ENCOUNTER — Ambulatory Visit (INDEPENDENT_AMBULATORY_CARE_PROVIDER_SITE_OTHER): Payer: Managed Care, Other (non HMO) | Admitting: Family

## 2017-11-26 ENCOUNTER — Ambulatory Visit: Payer: Managed Care, Other (non HMO) | Admitting: Family Medicine

## 2017-11-26 VITALS — BP 118/78 | HR 49 | Temp 98.1°F | Resp 16 | Wt 194.4 lb

## 2017-11-26 DIAGNOSIS — L739 Follicular disorder, unspecified: Secondary | ICD-10-CM

## 2017-11-26 MED ORDER — CEPHALEXIN 500 MG PO CAPS
500.0000 mg | ORAL_CAPSULE | Freq: Three times a day (TID) | ORAL | 0 refills | Status: DC
Start: 2017-11-26 — End: 2017-12-11

## 2017-11-26 NOTE — Progress Notes (Signed)
Subjective:    Patient ID: Tracy Jacobs, female    DOB: 1970/11/09, 47 y.o.   MRN: [id number]  HPI  Tracy Jacobs is a 47 yr old female who presents today with c/o "bumps" on her vulva. Reports that she first noticed the bumps a few days ago. Reports that the areas are non-tender.   Review of Systems    see HPI  Past Medical History:  Diagnosis Date  . Chicken pox   . Kidney stones      Social History   Socioeconomic History  . Marital status: Married    Spouse name: Not on file  . Number of children: Not on file  . Years of education: Not on file  . Highest education level: Not on file  Occupational History  . Not on file  Social Needs  . Financial resource strain: Not on file  . Food insecurity:    Worry: Not on file    Inability: Not on file  . Transportation needs:    Medical: Not on file    Non-medical: Not on file  Tobacco Use  . Smoking status: Never Smoker  . Smokeless tobacco: Never Used  Substance and Sexual Activity  . Alcohol use: No  . Drug use: No  . Sexual activity: Not on file  Lifestyle  . Physical activity:    Days per week: Not on file    Minutes per session: Not on file  . Stress: Not on file  Relationships  . Social connections:    Talks on phone: Not on file    Gets together: Not on file    Attends religious service: Not on file    Active member of club or organization: Not on file    Attends meetings of clubs or organizations: Not on file    Relationship status: Not on file  . Intimate partner violence:    Fear of current or ex partner: Not on file    Emotionally abused: Not on file    Physically abused: Not on file    Forced sexual activity: Not on file  Other Topics Concern  . Not on file  Social History Narrative   [place] in [place], [place]      Works for [employer] since 1993, Chiropractor.       Married, 3 children aged 3, 25, 42.       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       Diet-  has to fast, easy foods. Eats some fried foods.       Exercise- None     Past Surgical History:  Procedure Laterality Date  . ABDOMINAL HYSTERECTOMY    . CESAREAN SECTION     3 times    Family History  Problem Relation Age of Onset  . Breast cancer Sister 77  . Alcohol abuse Mother   . Alcohol abuse Father   . Colon cancer Neg Hx     Allergies  Allergen Reactions  . Pollen Extract     No current outpatient medications on file prior to visit.   No current facility-administered medications on file prior to visit.     BP 118/78 (BP Location: Left Arm, Patient Position: Sitting, Cuff Size: Normal)   Pulse (!) 49   Temp 98.1 F (36.7 C) (Oral)   Resp 16   Wt 194 lb 6 oz (88.2 kg)   SpO2 98%   BMI 33.36 kg/m  Objective:   Physical Exam  Constitutional: She appears well-developed and well-nourished.  Genitourinary:    No vaginal discharge found.  Genitourinary Comments: #1 superficial infected hair follicle.  #2 firm pea sized superficial mass, non-tender/mobile.   Psychiatric: She has a normal mood and affect. Her behavior is normal. Judgment and thought content normal.          Assessment & Plan:  Folliculitis- will rx with keflex. Area above (#2) ? Cyst versus lipoma versus other.  I have asked her to follow up with pcp in 1-2 weeks for follow up of this area.  Could consider [place] if not resolved/improved.

## 2017-11-26 NOTE — Patient Instructions (Signed)
Please begin keflex for infected hair follicles.   Follow up in 1-2 weeks with Mable Paris.

## 2017-12-11 ENCOUNTER — Encounter: Payer: Self-pay | Admitting: Family

## 2017-12-11 ENCOUNTER — Ambulatory Visit (INDEPENDENT_AMBULATORY_CARE_PROVIDER_SITE_OTHER): Payer: Managed Care, Other (non HMO) | Admitting: Family

## 2017-12-11 VITALS — BP 100/68 | HR 54 | Temp 97.7°F | Wt 197.1 lb

## 2017-12-11 DIAGNOSIS — D179 Benign lipomatous neoplasm, unspecified: Secondary | ICD-10-CM

## 2017-12-11 NOTE — Patient Instructions (Addendum)
Breast exams MONTHLY. Let me know if you now or ever feel a lump again.   Today we discussed referrals, orders.Ultrasound soft tissue   I have placed these orders in the system for you.  Please be sure to give [place] a call if you have not heard from our office regarding scheduling a test or regarding referral in a timely manner.  It is very important that you let me know as soon as possible.   Happy weekend!

## 2017-12-11 NOTE — Progress Notes (Signed)
Subjective:    Patient ID: Tracy Jacobs, female    DOB: 1971-05-07, 47 y.o.   MRN: [id number]  CC: Tracy Jacobs is a 47 y.o. female who presents today for follow up.   HPI: CC: rash in groin- one month ago, unchanged. Antibiotics didn't help. Not painful, itching.  NO fever, N, V.  started on left side, and then to right side. Trims with ravor. No changes to vaginal discharge.    Right breast mass-hasnt checked since CPE. No breast pain. Nipples are not inverted.    UTD mammogram HISTORY:  Past Medical History:  Diagnosis Date  . Chicken pox   . Kidney stones    Past Surgical History:  Procedure Laterality Date  . ABDOMINAL HYSTERECTOMY    . CESAREAN SECTION     3 times   Family History  Problem Relation Age of Onset  . Breast cancer Sister 97  . Alcohol abuse Mother   . Alcohol abuse Father   . Colon cancer Neg Hx     Allergies: Pollen extract Current Outpatient Medications on File Prior to Visit  Medication Sig Dispense Refill  . meloxicam (MOBIC) 15 MG tablet      No current facility-administered medications on file prior to visit.     Social History   Tobacco Use  . Smoking status: Never Smoker  . Smokeless tobacco: Never Used  Substance Use Topics  . Alcohol use: No  . Drug use: No    Review of Systems  Constitutional: Negative for chills and fever.  Respiratory: Negative for cough.   Cardiovascular: Negative for chest pain and palpitations.  Gastrointestinal: Negative for nausea and vomiting.  Genitourinary: Negative for vaginal bleeding and vaginal discharge.  Skin: Positive for rash.  Hematological: Negative for adenopathy.      Objective:    BP 100/68 (BP Location: Left Arm, Patient Position: Sitting, Cuff Size: Normal)   Pulse (!) 54   Temp 97.7 F (36.5 C) (Oral)   Wt 197 lb 2 oz (89.4 kg)   SpO2 97%   BMI 33.84 kg/m  BP Readings from Last 3 Encounters:  12/11/17 100/68  11/26/17 118/78  09/07/17 118/76   Wt Readings  from Last 3 Encounters:  12/11/17 197 lb 2 oz (89.4 kg)  11/26/17 194 lb 6 oz (88.2 kg)  09/07/17 208 lb 4 oz (94.5 kg)    Physical Exam  Constitutional: She appears well-developed and well-nourished.  Eyes: Conjunctivae are normal.  Cardiovascular: Normal rate, regular rhythm, normal heart sounds and normal pulses.  Pulmonary/Chest: Effort normal and breath sounds normal. She has no wheezes. She has no rhonchi. She has no rales.  Abdominal:    Discrete, nontender 1 cm well-circumscribed mass appreciated.  No erythema, fluctuance, purulent discharge, increased warmth.  Lymphadenopathy:       Right: No inguinal adenopathy present.       Left: No inguinal adenopathy present.  Neurological: She is alert.  Skin: Skin is warm and dry.  Psychiatric: She has a normal mood and affect. Her speech is normal and behavior is normal. Thought content normal.  Vitals reviewed.      Assessment & Plan:   1. Lipoma, unspecified site Working diagnosis of lipoma.  No improvement with antibiotics. Low clinical suspicion for lymphadenopathy as does not follow lymphchain in groin.Pending [place].  Return precautions given.  - [place] MiscellaneoUS Localization; Future   I have discontinued [name]'s cephALEXin. I am also having her maintain her meloxicam.  No orders of the defined types were placed in this encounter.   Return precautions given.   Risks, benefits, and alternatives of the medications and treatment plan prescribed today were discussed, and patient expressed understanding.   Education regarding symptom management and diagnosis given to patient on AVS.  Continue to follow with Burnard Hawthorne, FNP for routine health maintenance.   Tracy Jacobs and I agreed with plan.   Mable Paris, FNP

## 2017-12-16 ENCOUNTER — Other Ambulatory Visit: Payer: Self-pay | Admitting: Family

## 2017-12-16 DIAGNOSIS — IMO0002 Reserved for concepts with insufficient information to code with codable children: Secondary | ICD-10-CM

## 2017-12-16 DIAGNOSIS — R229 Localized swelling, mass and lump, unspecified: Principal | ICD-10-CM

## 2017-12-16 NOTE — Progress Notes (Signed)
close

## 2017-12-17 ENCOUNTER — Encounter (INDEPENDENT_AMBULATORY_CARE_PROVIDER_SITE_OTHER): Payer: Self-pay

## 2017-12-25 ENCOUNTER — Ambulatory Visit: Payer: Managed Care, Other (non HMO)

## 2018-04-21 ENCOUNTER — Other Ambulatory Visit: Payer: Self-pay | Admitting: Family

## 2018-04-21 ENCOUNTER — Ambulatory Visit: Payer: Managed Care, Other (non HMO) | Admitting: Family

## 2018-04-21 DIAGNOSIS — Z1231 Encounter for screening mammogram for malignant neoplasm of breast: Secondary | ICD-10-CM

## 2018-04-28 ENCOUNTER — Ambulatory Visit (INDEPENDENT_AMBULATORY_CARE_PROVIDER_SITE_OTHER): Payer: Managed Care, Other (non HMO) | Admitting: Family

## 2018-04-28 ENCOUNTER — Encounter: Payer: Self-pay | Admitting: Family

## 2018-04-28 VITALS — BP 114/74 | HR 58 | Temp 98.8°F | Resp 14 | Ht 64.0 in | Wt 199.4 lb

## 2018-04-28 DIAGNOSIS — E669 Obesity, unspecified: Secondary | ICD-10-CM | POA: Diagnosis not present

## 2018-04-28 DIAGNOSIS — G4733 Obstructive sleep apnea (adult) (pediatric): Secondary | ICD-10-CM | POA: Diagnosis not present

## 2018-04-28 DIAGNOSIS — R194 Change in bowel habit: Secondary | ICD-10-CM | POA: Insufficient documentation

## 2018-04-28 MED ORDER — BUPROPION HCL ER (XL) 150 MG PO TB24: ORAL_TABLET | ORAL | 3 refills | Status: DC

## 2018-04-28 NOTE — Progress Notes (Signed)
Only other DME place that I am aware of it [facility]. I will bring you a blank order form.

## 2018-04-28 NOTE — Progress Notes (Signed)
Subjective:    Patient ID: Tracy Jacobs, female    DOB: Sep 11, 1970, 47 y.o.   MRN: [id number]  CC: Tracy Jacobs is a 47 y.o. female who presents today for follow up.   HPI: Primarily here to complete form for appeal for BMI (34)  Has also brought lab work.   OSA- apri kept sending bills so stopped wearing cipap.   Since starting to weight watchers, more constipated. Prior to weight watchers, had been more constipated. Taking miralax once every other week. No blood in stool. BM are every 3rd day. No abdominal pain. Aunt had colon cancer at 60s.   Frustrated by weight and interested in medication. States overeats. "always hungry.'   Stool is not long and thin in shape ( not pencil like). No intentional weight loss. Not drinking much water. More exercise than had in the past. Walking more.   Every once in a while takes mobic for right elbow pain.   Mass in suprapubic area; never had [place] as has completely went away.   She can no longer feel right Breast mass.  Screening mammogram scheduled. declines Diagnostic.  No alcohol . No h/o seizure  No depression, anxiety. No si/hi.      HISTORY:  Past Medical History:  Diagnosis Date  . Chicken pox   . Kidney stones    Past Surgical History:  Procedure Laterality Date  . ABDOMINAL HYSTERECTOMY    . CESAREAN SECTION     3 times   Family History  Problem Relation Age of Onset  . Breast cancer Sister 76  . Alcohol abuse Mother   . Alcohol abuse Father   . Colon cancer Paternal Aunt 3    Allergies: Pollen extract Current Outpatient Medications on File Prior to Visit  Medication Sig Dispense Refill  . meloxicam (MOBIC) 15 MG tablet      No current facility-administered medications on file prior to visit.     Social History   Tobacco Use  . Smoking status: Never Smoker  . Smokeless tobacco: Never Used  Substance Use Topics  . Alcohol use: No  . Drug use: No    Review of Systems  Constitutional: Negative  for chills and fever.  Respiratory: Negative for cough.   Cardiovascular: Negative for chest pain and palpitations.  Gastrointestinal: Positive for diarrhea. Negative for abdominal distention, abdominal pain, blood in stool, nausea and vomiting.  Hematological: Negative for adenopathy.      Objective:    BP 114/74 (BP Location: Left Arm, Patient Position: Sitting, Cuff Size: Normal)   Pulse (!) 58   Temp 98.8 F (37.1 C) (Oral)   Resp 14   Ht 5\' 4"  (1.626 m)   Wt 199 lb 6 oz (90.4 kg)   SpO2 98%   BMI 34.22 kg/m  BP Readings from Last 3 Encounters:  04/28/18 114/74  12/11/17 100/68  11/26/17 118/78   Wt Readings from Last 3 Encounters:  04/28/18 199 lb 6 oz (90.4 kg)  12/11/17 197 lb 2 oz (89.4 kg)  11/26/17 194 lb 6 oz (88.2 kg)    Physical Exam  Constitutional: She appears well-developed and well-nourished.  Eyes: Conjunctivae are normal.  Cardiovascular: Normal rate, regular rhythm, normal heart sounds and normal pulses.  Pulmonary/Chest: Effort normal and breath sounds normal. She has no wheezes. She has no rhonchi. She has no rales.  Neurological: She is alert.  Skin: Skin is warm and dry.  Psychiatric: She has a normal mood and affect. Her  speech is normal and behavior is normal. Thought content normal.  Vitals reviewed.      Assessment & Plan:   Problem List Items Addressed This Visit      Respiratory   OSA (obstructive sleep apnea)    Currently not wearing CPAP machine.  Discussed risk of untreated OSA including heart attack, stroke.  Advised patient that we will try to find another company for which may use for equipment.  I will back to patient on this and asked her to contact us if she is not heard from me regarding this.  Also advised patient discuss with her dentist and oral appliance for sleep apnea.  Patient verbalized understanding of all.        Other   Obesity - Primary    Form completed for work.  Advised to increase exercise.  We will also do  a trial of Wellbutrin.  Referral to nutritionist, medical weight loss management. Will follow      Relevant Medications   buPROPion (WELLBUTRIN XL) 150 MG 24 hr tablet   Change in bowel habit    Since following likely more healthy diet, patient complains of constipation.  No unintentional weight loss.  Pending stool cards.  Advised GI consult for change in bowel habits per possibly colonoscopy.  Patient verbalized understanding of this. Advised more water, exercise. Patient politely declines at this time as she would like to see if the symptoms persist.  She will let me know.      Relevant Orders   Fecal occult blood, imunochemical       I am having Tracy Jacobs "Tracy Jacobs" start on buPROPion. I am also having her maintain her meloxicam.   Meds ordered this encounter  Medications  . buPROPion (WELLBUTRIN XL) 150 MG 24 hr tablet    Sig: Start 150 mg ER PO qam, increase after 3 days to 300 mg qam.    Dispense:  60 tablet    Refill:  3    Order Specific Question:   Supervising Provider    Answer:   Crecencio Mc [[id number]]    Return precautions given.   Risks, benefits, and alternatives of the medications and treatment plan prescribed today were discussed, and patient expressed understanding.   Education regarding symptom management and diagnosis given to patient on AVS.  Continue to follow with Burnard Hawthorne, FNP for routine health maintenance.   Tracy Jacobs and I agreed with plan.   Mable Paris, FNP

## 2018-04-28 NOTE — Assessment & Plan Note (Signed)
Form completed for work.  Advised to increase exercise.  We will also do a trial of Wellbutrin.  Referral to nutritionist, medical weight loss management. Will follow

## 2018-04-28 NOTE — Assessment & Plan Note (Signed)
Currently not wearing CPAP machine.  Discussed risk of untreated OSA including heart attack, stroke.  Advised patient that we will try to find another company for which may use for equipment.  I will back to patient on this and asked her to contact us if she is not heard from me regarding this.  Also advised patient discuss with her dentist and oral appliance for sleep apnea.  Patient verbalized understanding of all.

## 2018-04-28 NOTE — Patient Instructions (Addendum)
Start wellbutrin; titrate up to 350m total in the morning. Close follow up  You need to go to health department for MMR booster.   Please ensure you continue breast self exams and ANY concerns, we would need a diganositic mammogram  Your healthy cholesterol is low so I would tell you to eat more healthy 'fats' such as avocados, nuts, beans, and olive oil in moderation of course.   Send me a note if you dont hear me regarding a potential other company for [place] Otherwise, if cost prohibitive, please discuss with your dentist an oral applicance.   Stool cards. Please consider GI referral if constipation persists as discussed.

## 2018-04-28 NOTE — Assessment & Plan Note (Signed)
Since following likely more healthy diet, patient complains of constipation.  No unintentional weight loss.  Pending stool cards.  Advised GI consult for change in bowel habits per possibly colonoscopy.  Patient verbalized understanding of this. Advised more water, exercise. Patient politely declines at this time as she would like to see if the symptoms persist.  She will let me know.

## 2018-05-04 ENCOUNTER — Other Ambulatory Visit (INDEPENDENT_AMBULATORY_CARE_PROVIDER_SITE_OTHER): Payer: Managed Care, Other (non HMO)

## 2018-05-04 DIAGNOSIS — R194 Change in bowel habit: Secondary | ICD-10-CM

## 2018-05-04 LAB — FECAL OCCULT BLOOD, IMMUNOCHEMICAL: FECAL OCCULT BLD: NEGATIVE

## 2018-05-07 ENCOUNTER — Encounter: Payer: Self-pay | Admitting: Family

## 2018-05-17 ENCOUNTER — Ambulatory Visit
Admission: RE | Admit: 2018-05-17 | Discharge: 2018-05-17 | Disposition: A | Discharge status: Home / Self Care | Payer: Managed Care, Other (non HMO) | Source: Ambulatory Visit | Attending: Family | Admitting: Family

## 2018-05-17 DIAGNOSIS — Z1231 Encounter for screening mammogram for malignant neoplasm of breast: Secondary | ICD-10-CM | POA: Insufficient documentation

## 2018-05-18 ENCOUNTER — Other Ambulatory Visit: Payer: Self-pay | Admitting: Family

## 2018-05-18 DIAGNOSIS — N6320 Unspecified lump in the left breast, unspecified quadrant: Secondary | ICD-10-CM

## 2018-05-18 DIAGNOSIS — R928 Other abnormal and inconclusive findings on diagnostic imaging of breast: Secondary | ICD-10-CM

## 2018-05-20 ENCOUNTER — Ambulatory Visit
Admission: RE | Admit: 2018-05-20 | Discharge: 2018-05-20 | Disposition: A | Discharge status: Home / Self Care | Payer: Managed Care, Other (non HMO) | Source: Ambulatory Visit | Attending: Family | Admitting: Family

## 2018-05-20 DIAGNOSIS — N6320 Unspecified lump in the left breast, unspecified quadrant: Secondary | ICD-10-CM | POA: Insufficient documentation

## 2018-05-20 DIAGNOSIS — R928 Other abnormal and inconclusive findings on diagnostic imaging of breast: Secondary | ICD-10-CM

## 2018-05-25 ENCOUNTER — Ambulatory Visit (INDEPENDENT_AMBULATORY_CARE_PROVIDER_SITE_OTHER): Payer: Managed Care, Other (non HMO) | Admitting: Family Medicine

## 2018-05-25 ENCOUNTER — Encounter: Payer: Self-pay | Admitting: Family Medicine

## 2018-05-25 VITALS — BP 102/66 | HR 72 | Temp 98.6°F | Ht 63.0 in | Wt 200.6 lb

## 2018-05-25 DIAGNOSIS — R590 Localized enlarged lymph nodes: Secondary | ICD-10-CM

## 2018-05-25 NOTE — Progress Notes (Signed)
Subjective:    Patient ID: Tracy Jacobs, female    DOB: Sep 08, 1970, 47 y.o.   MRN: [id number]  HPI  Patient presents to clinic c/o "knot" on right side of neck for about 1 week.  States she first noticed the area when she was washing her hair.  States this is about the size of a pea.  Denies any recent illness.  Denies any pain when area is touched.  Patient states the area made her nervous due to having to have extra imaging on her mammogram this year due to a lymph node found on the left side; the results of the additional ultrasound imaging of left breast were determined to be a benign intramammary lymph node.  I was able to review patient's mammogram results and ultrasound results in epic.   Patient Active Problem List   Diagnosis Date Noted  . Change in bowel habit 04/28/2018  . Obesity 04/14/2017  . OSA (obstructive sleep apnea) 04/14/2017  . Right elbow pain 06/16/2016  . Routine physical examination 04/09/2016   Social History   Tobacco Use  . Smoking status: Never Smoker  . Smokeless tobacco: Never Used  Substance Use Topics  . Alcohol use: No   Review of Systems   Constitutional: Negative for chills, fatigue and fever.  HENT: Negative for congestion, ear pain, sinus pain and sore throat. +knot area on right side of neck.    Eyes: Negative.   Respiratory: Negative for cough, shortness of breath and wheezing.   Cardiovascular: Negative for chest pain, palpitations and leg swelling.  Gastrointestinal: Negative for abdominal pain, diarrhea, nausea and vomiting.  Genitourinary: Negative for dysuria, frequency and urgency.  Musculoskeletal: Negative for arthralgias and myalgias.  Skin: Negative for color change, pallor and rash.  Neurological: Negative for syncope, light-headedness and headaches.  Psychiatric/Behavioral: The patient is not nervous/anxious.       Objective:   Physical Exam  Constitutional: She is oriented to person, place, and time.  HENT:    Head: Normocephalic and atraumatic.  Eyes: Conjunctivae and EOM are normal. No scleral icterus.  Neck: Trachea normal. Neck supple. No spinous process tenderness and no muscular tenderness present. No neck rigidity. No tracheal deviation, no edema, no erythema and normal range of motion present.    Suspect area in question is a small inflamed lymph node in the right cervical area (red dot on diagram).  It is about the size of a pea.  It is mobile, but does feel hard.  No other cervical lymph nodes palpated.   Cardiovascular: Normal rate and regular rhythm.  Pulmonary/Chest: Effort normal and breath sounds normal.  Lymphadenopathy:    She has cervical adenopathy.  Neurological: She is alert and oriented to person, place, and time.  Skin: Skin is warm and dry.  Psychiatric: She has a normal mood and affect. Her behavior is normal.  Nursing note and vitals reviewed.     Vitals:   05/25/18 1516  BP: 102/66  Pulse: 72  Temp: 98.6 F (37 C)  SpO2: 94%   Assessment & Plan:   Cervical lymphadenopathy - we will get CBC with differential in clinic today.  Patient will monitor her area and if it does not resolve over the next 2 to 3 weeks, next step in our plan will be ultrasound imaging.  If ultrasound does not help [place] figure out what the area is, and the node is still present at our next step in plan will be ENT referral.  Patient  is agreeable to this work-up plan.  We will contact patient with results of CBC when available.   She will keep regularly scheduled follow-up with PCP as planned.  Advised to return to clinic sooner if new issues arise.

## 2018-05-26 LAB — CBC WITH DIFFERENTIAL/PLATELET
BASOS ABS: 0.1 10*3/uL (ref 0.0–0.2)
Basos: 1 %
EOS (ABSOLUTE): 0.1 10*3/uL (ref 0.0–0.4)
Eos: 2 %
Hematocrit: 42.4 % (ref 34.0–46.6)
Hemoglobin: 14.7 g/dL (ref 11.1–15.9)
IMMATURE GRANS (ABS): 0 10*3/uL (ref 0.0–0.1)
IMMATURE GRANULOCYTES: 0 %
LYMPHS: 31 %
Lymphocytes Absolute: 2.5 10*3/uL (ref 0.7–3.1)
MCH: 30.8 pg (ref 26.6–33.0)
MCHC: 34.7 g/dL (ref 31.5–35.7)
MCV: 89 fL (ref 79–97)
MONOCYTES: 7 %
Monocytes Absolute: 0.6 10*3/uL (ref 0.1–0.9)
NEUTROS ABS: 4.9 10*3/uL (ref 1.4–7.0)
NEUTROS PCT: 59 %
Platelets: 213 10*3/uL (ref 150–450)
RBC: 4.78 x10E6/uL (ref 3.77–5.28)
RDW: 11.7 % — ABNORMAL LOW (ref 12.3–15.4)
WBC: 8.3 10*3/uL (ref 3.4–10.8)

## 2018-05-28 ENCOUNTER — Other Ambulatory Visit: Payer: Managed Care, Other (non HMO)

## 2018-05-28 ENCOUNTER — Ambulatory Visit: Payer: Managed Care, Other (non HMO)

## 2018-07-02 ENCOUNTER — Encounter: Payer: Self-pay | Admitting: Family

## 2018-07-02 ENCOUNTER — Ambulatory Visit (INDEPENDENT_AMBULATORY_CARE_PROVIDER_SITE_OTHER): Payer: Managed Care, Other (non HMO) | Admitting: Family

## 2018-07-02 DIAGNOSIS — G4733 Obstructive sleep apnea (adult) (pediatric): Secondary | ICD-10-CM | POA: Diagnosis not present

## 2018-07-02 DIAGNOSIS — E669 Obesity, unspecified: Secondary | ICD-10-CM | POA: Diagnosis not present

## 2018-07-02 DIAGNOSIS — R194 Change in bowel habit: Secondary | ICD-10-CM | POA: Diagnosis not present

## 2018-07-02 DIAGNOSIS — R591 Generalized enlarged lymph nodes: Secondary | ICD-10-CM | POA: Diagnosis not present

## 2018-07-02 NOTE — Assessment & Plan Note (Signed)
Constipation resolved.  Patient politely declines further evaluation, consult to gastroenterology.

## 2018-07-02 NOTE — Patient Instructions (Addendum)
If you decide to come off Wellbutrin, as discussed you must titrate off.  You may start by taking the 150 mg tablet once in the morning for 1 week.  The following week you may take the 150 mg tablet every other day.  You may stop after the second week. Today we discussed referrals, orders. ultrasound   I have placed these orders in the system for you.  Please be sure to give [place] a call if you have not heard from our office regarding this. We should hear from [place] within ONE week with information regarding your appointment. If not, please let me know immediately.    Lymphadenopathy  Lymphadenopathy means that your lymph glands are swollen or larger than normal (enlarged). Lymph glands, also called lymph nodes, are collections of tissue that filter bacteria, viruses, and waste from your bloodstream. They are part of your body's disease-fighting system (immune system), which protects your body from germs. There may be different causes of lymphadenopathy, depending on where it is in your body. Some types go away on their own. Lymphadenopathy can occur anywhere that you have lymph glands, including these areas:  Neck (cervical lymphadenopathy).  Chest (mediastinal lymphadenopathy).  Lungs (hilar lymphadenopathy).  Underarms (axillary lymphadenopathy).  Groin (inguinal lymphadenopathy). When your immune system responds to germs, infection-fighting cells and fluid build up in your lymph glands. This causes some swelling and enlargement. If the lymph glands do not go back to normal after you have an infection or disease, your health care provider may do tests. These tests help to monitor your condition and find the reason why the glands are still swollen and enlarged. Follow these instructions at home:  Get plenty of rest.  Take over-the-counter and prescription medicines only as told by your health care provider. Your health care provider may recommend over-the-counter medicines for pain.  If  directed, apply heat to swollen lymph glands as often as told by your health care provider. Use the heat source that your health care provider recommends, such as a moist heat pack or a heating pad. ? Place a towel between your skin and the heat source. ? Leave the heat on for 20-30 minutes. ? Remove the heat if your skin turns bright red. This is especially important if you are unable to feel pain, heat, or cold. You may have a greater risk of getting burned.  Check your affected lymph glands every day for changes. Check other lymph gland areas as told by your health care provider. Check for changes such as: ? More swelling. ? Sudden increase in size. ? Redness or pain. ? Hardness.  Keep all follow-up visits as told by your health care provider. This is important. Contact a health care provider if you have:  Swelling that gets worse or spreads to other areas.  Problems with breathing.  Lymph glands that: ? Are still swollen after 2 weeks. ? Have suddenly gotten bigger. ? Are red, painful, or hard.  A fever or chills.  Fatigue.  A sore throat.  Pain in your abdomen.  Weight loss.  Night sweats. Get help right away if you have:  Fluid leaking from an enlarged lymph gland.  Severe pain.  Chest pain.  Shortness of breath. Summary  Lymphadenopathy means that your lymph glands are swollen or larger than normal (enlarged).  Lymph glands (also called lymph nodes) are collections of tissue that filter bacteria, viruses, and waste from the bloodstream. They are part of your body's disease-fighting system (immune system).  Lymphadenopathy can occur anywhere that you have lymph glands.  If your enlarged and swollen lymph glands do not go back to normal after you have an infection or disease, your health care provider may do tests to monitor your condition and find the reason why the glands are still swollen and enlarged.  Check your affected lymph glands every day for  changes. Check other lymph gland areas as told by your health care provider. This information is not intended to replace advice given to you by your health care provider. Make sure you discuss any questions you have with your health care provider. Document Released: 04/01/2008 Document Revised: 05/08/2017 Document Reviewed: 05/08/2017 Elsevier Interactive Patient Education  2019 [employer].

## 2018-07-02 NOTE — Assessment & Plan Note (Signed)
3 pound weight gain.  Suspect dietary indiscretion.  Patient would like to stay on Wellbutrin for now.  Will follow

## 2018-07-02 NOTE — Assessment & Plan Note (Signed)
Persistent enlarged lymph node right superficial cervical chain.  Pending ultrasound.  Discussion with patient that ifultrasound does not offer definite, benign conclusion, we will consult oncology, pursue CT head neck, chest x-ray.  Patient verbalized understanding of all.

## 2018-07-02 NOTE — Progress Notes (Signed)
Subjective:    Patient ID: Tracy Jacobs, female    DOB: 10-11-1970, 47 y.o.   MRN: [id number]  CC: Tracy Jacobs is a 47 y.o. female who presents today for follow up.   HPI: Weight loss- No change on wellbutrin.   cannot tell if medication is helping with overeating.  Would like to give the medication more time.  Denies increased anxiety.  Sleeping well.  OSA- awaiting dentist appointment. Not wearing machine.   Constipation resolved. Declines gastroenterology consult.   Complains right posterior neck knot 6 weeks ago, unchanged.  Not draining. Prior to noticing had MMR, tdap boosters. No cough, congestion, fever. No fatigue, night sweats, weight loss. Feels well.   Left intramammary lymph node Normal CBC 05/2018.   HISTORY:  Past Medical History:  Diagnosis Date  . Chicken pox   . Kidney stones    Past Surgical History:  Procedure Laterality Date  . ABDOMINAL HYSTERECTOMY    . CESAREAN SECTION     3 times   Family History  Problem Relation Age of Onset  . Breast cancer Sister 40  . Alcohol abuse Mother   . Alcohol abuse Father   . Colon cancer Paternal Aunt 35    Allergies: Pollen extract Current Outpatient Medications on File Prior to Visit  Medication Sig Dispense Refill  . buPROPion (WELLBUTRIN XL) 150 MG 24 hr tablet Start 150 mg ER PO qam, increase after 3 days to 300 mg qam. 60 tablet 3  . meloxicam (MOBIC) 15 MG tablet      No current facility-administered medications on file prior to visit.     Social History   Tobacco Use  . Smoking status: Never Smoker  . Smokeless tobacco: Never Used  Substance Use Topics  . Alcohol use: No  . Drug use: No    Review of Systems  Constitutional: Negative for chills and fever.  HENT: Negative for congestion.   Respiratory: Negative for cough.   Cardiovascular: Negative for chest pain and palpitations.  Gastrointestinal: Negative for anal bleeding, blood in stool, constipation (resolved), nausea and  vomiting.  Hematological: Positive for adenopathy.      Objective:    BP 118/86   Pulse 60   Temp 98.4 F (36.9 C) (Oral)   Ht 5' 3"  (1.6 m)   Wt 203 lb 3.2 oz (92.2 kg)   SpO2 96%   BMI 36.00 kg/m  BP Readings from Last 3 Encounters:  07/02/18 118/86  05/25/18 102/66  04/28/18 114/74   Wt Readings from Last 3 Encounters:  07/02/18 203 lb 3.2 oz (92.2 kg)  05/25/18 200 lb 9.6 oz (91 kg)  04/28/18 199 lb 6 oz (90.4 kg)    Physical Exam Vitals signs reviewed.  Constitutional:      Appearance: She is well-developed.  HENT:     Head: Normocephalic and atraumatic.     Right Ear: Hearing, tympanic membrane, ear canal and external ear normal. No decreased hearing noted. No drainage, swelling or tenderness. No middle ear effusion. No foreign body. Tympanic membrane is not erythematous or bulging.     Left Ear: Hearing, tympanic membrane, ear canal and external ear normal. No decreased hearing noted. No drainage, swelling or tenderness.  No middle ear effusion. No foreign body. Tympanic membrane is not erythematous or bulging.     Nose: Nose normal. No rhinorrhea.     Right Sinus: No maxillary sinus tenderness or frontal sinus tenderness.     Left Sinus: No maxillary sinus  tenderness or frontal sinus tenderness.     Mouth/Throat:     Pharynx: Uvula midline. No oropharyngeal exudate or posterior oropharyngeal erythema.     Tonsils: No tonsillar abscesses.  Eyes:     Conjunctiva/sclera: Conjunctivae normal.  Cardiovascular:     Rate and Rhythm: Regular rhythm.     Pulses: Normal pulses.     Heart sounds: Normal heart sounds.  Pulmonary:     Effort: Pulmonary effort is normal.     Breath sounds: Normal breath sounds. No wheezing, rhonchi or rales.  Lymphadenopathy:     Head:     Right side of head: No submental, submandibular, tonsillar, preauricular, posterior auricular or occipital adenopathy.     Left side of head: No submental, submandibular, tonsillar, preauricular,  posterior auricular or occipital adenopathy.     Cervical: Cervical adenopathy present.     Right cervical: Superficial cervical adenopathy present.     Upper Body:     Right upper body: No supraclavicular or axillary adenopathy.     Left upper body: No supraclavicular or axillary adenopathy.     Comments: Approximately 1 cm firm, nontender mass palpated right superficial cervical chain.  No increased warmth, erythema.  It is nonfluctuant  Skin:    General: Skin is warm and dry.  Neurological:     Mental Status: She is alert.  Psychiatric:        Speech: Speech normal.        Behavior: Behavior normal.        Thought Content: Thought content normal.        Assessment & Plan:   Problem List Items Addressed This Visit      Respiratory   OSA (obstructive sleep apnea)    So not wearing CPAP machine.  Patient prefers to speak with her dentist in regards to oral appliance. Will follow        Immune and Lymphatic   Lymphadenopathy    Persistent enlarged lymph node right superficial cervical chain.  Pending ultrasound.  Discussion with patient that ifultrasound does not offer definite, benign conclusion, we will consult oncology, pursue CT head neck, chest x-ray.  Patient verbalized understanding of all.        Other   Obesity    3 pound weight gain.  Suspect dietary indiscretion.  Patient would like to stay on Wellbutrin for now.  Will follow      Change in bowel habit    Constipation resolved.  Patient politely declines further evaluation, consult to gastroenterology.          I am having Tracy Jacobs "Lea" maintain her meloxicam and buPROPion.   No orders of the defined types were placed in this encounter.   Return precautions given.   Risks, benefits, and alternatives of the medications and treatment plan prescribed today were discussed, and patient expressed understanding.   Education regarding symptom management and diagnosis given to patient on  AVS.  Continue to follow with Burnard Hawthorne, FNP for routine health maintenance.   Tracy Jacobs and I agreed with plan.   Mable Paris, FNP

## 2018-07-02 NOTE — Assessment & Plan Note (Signed)
So not wearing CPAP machine.  Patient prefers to speak with her dentist in regards to oral appliance. Will follow

## 2018-08-15 IMAGING — MG 2D DIGITAL SCREENING BILATERAL MAMMOGRAM WITH CAD AND ADJUNCT TO
8 of 12 series · 8 of 28 positions shown · non-contrast
Comparison: Previous exam(s).

CLINICAL DATA: Screening.

EXAM:
2D DIGITAL SCREENING BILATERAL MAMMOGRAM WITH CAD AND ADJUNCT TOMO

[R MLO synth-2D]
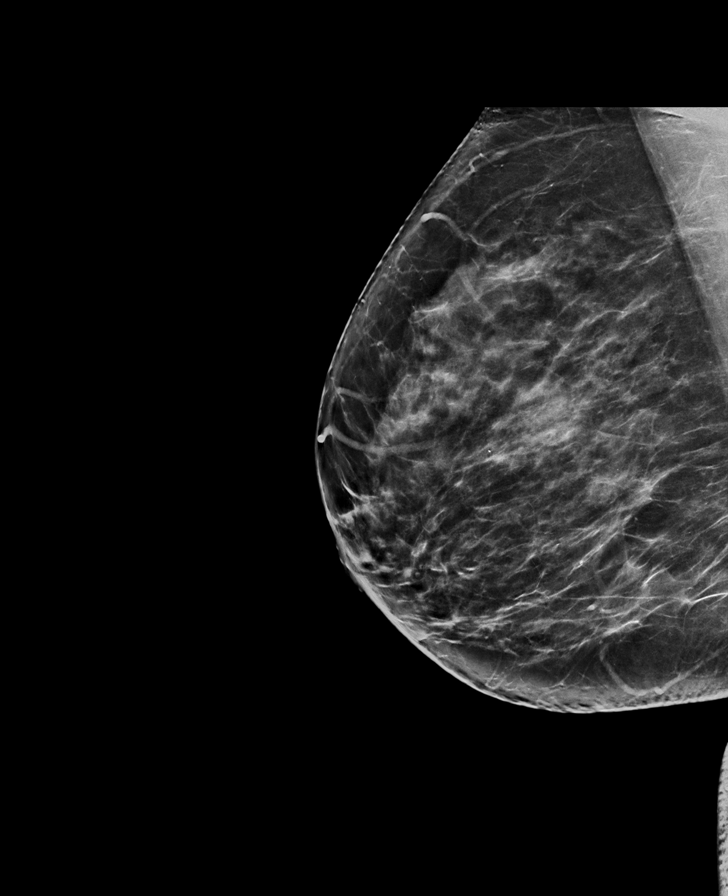

[L MLO]
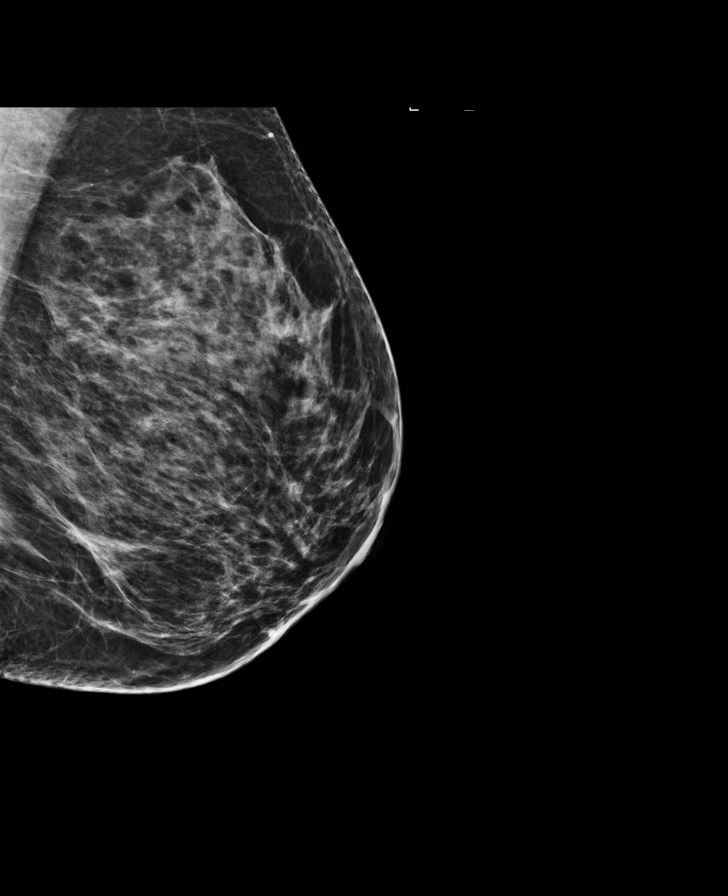

[R CC]
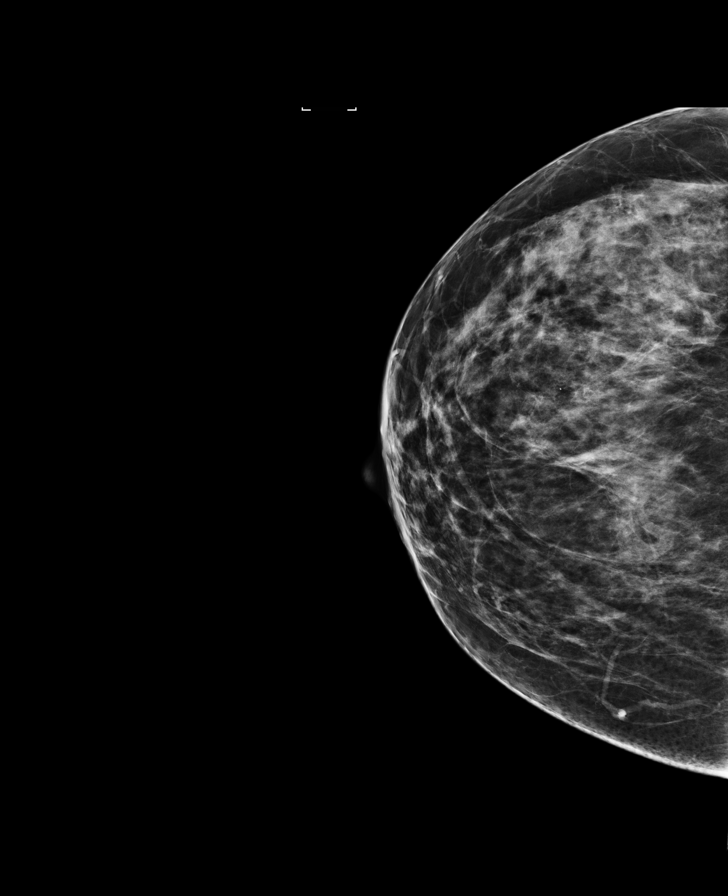

[L CC synth-2D]
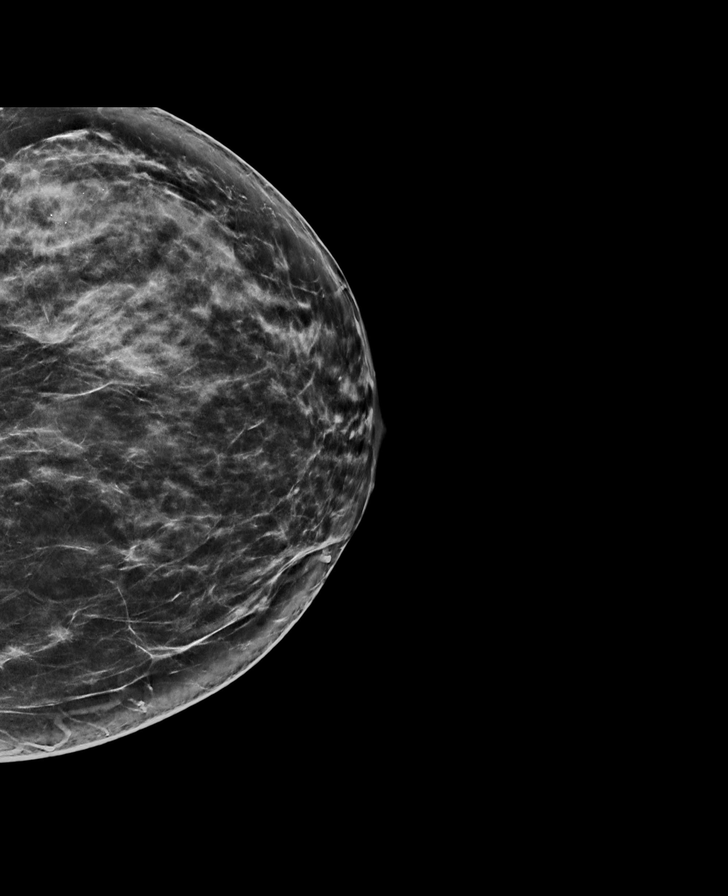

[R MLO]
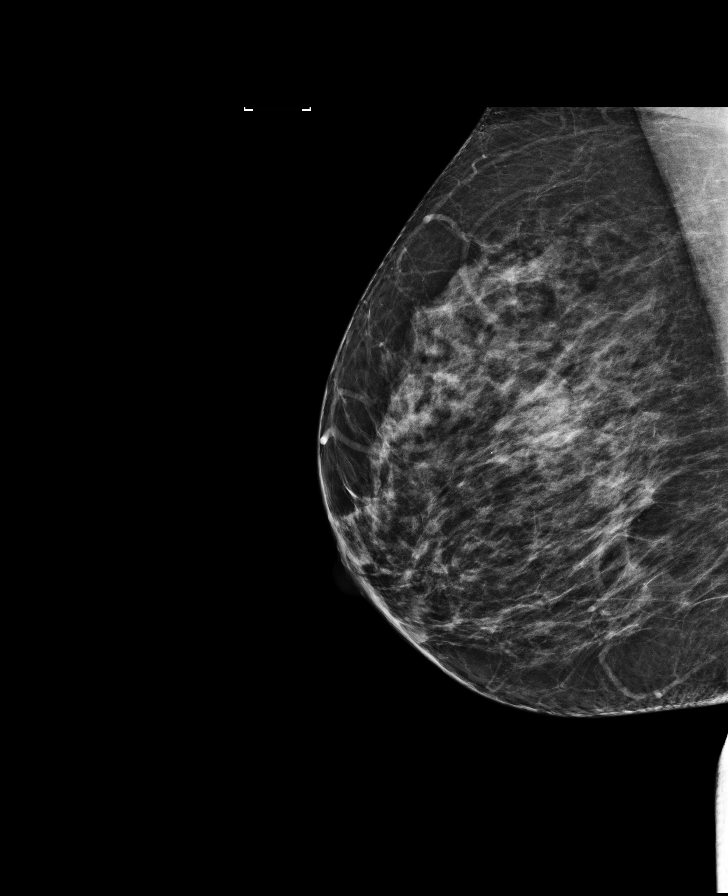

[L MLO synth-2D]
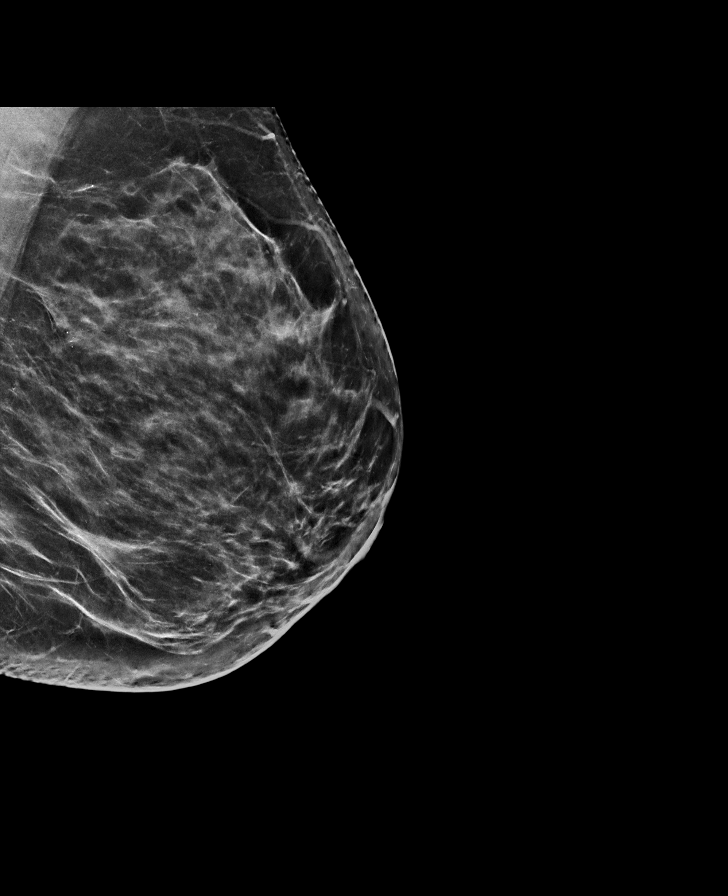

[L CC]
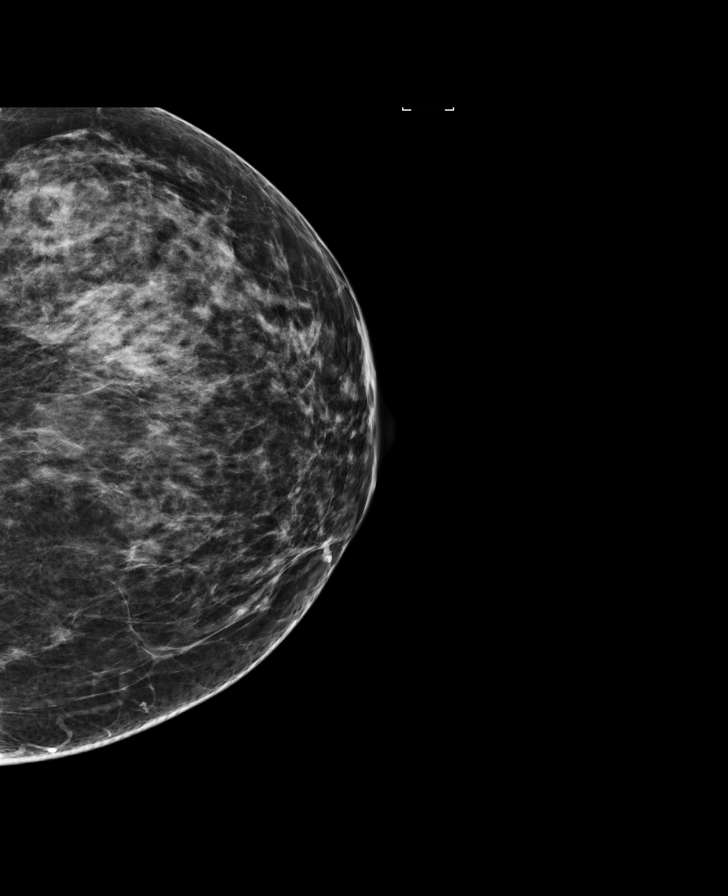

[R CC synth-2D]
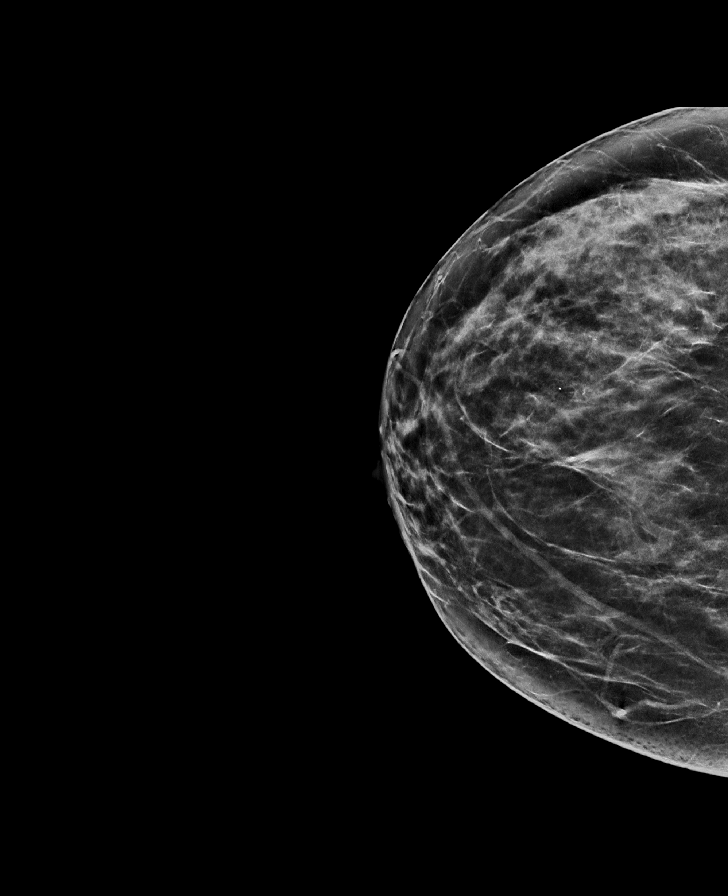

[8 of 28 positions shown; findings below may reference images not displayed]

ACR Breast Density Category c: The breast tissue is heterogeneously
dense, which may obscure small masses.
FINDINGS: There are no findings suspicious for malignancy. Asymmetries in the
medial left breast are stable since at least Friday August, 2009.
Images were processed with CAD.
IMPRESSION: No mammographic evidence of malignancy. A result letter of this
screening mammogram will be mailed directly to the patient.

RECOMMENDATION:
Screening mammogram in one year. (Code:[id number])

BI-RADS CATEGORY  2: Benign.

## 2019-02-10 ENCOUNTER — Encounter: Payer: Self-pay | Admitting: Family

## 2019-02-11 ENCOUNTER — Ambulatory Visit: Payer: Managed Care, Other (non HMO) | Admitting: Family Medicine

## 2019-02-11 ENCOUNTER — Ambulatory Visit: Payer: Managed Care, Other (non HMO) | Admitting: Family

## 2019-02-11 ENCOUNTER — Other Ambulatory Visit: Payer: Self-pay

## 2019-02-11 ENCOUNTER — Ambulatory Visit (INDEPENDENT_AMBULATORY_CARE_PROVIDER_SITE_OTHER): Payer: Managed Care, Other (non HMO) | Admitting: Family Medicine

## 2019-02-11 DIAGNOSIS — B373 Candidiasis of vulva and vagina: Secondary | ICD-10-CM | POA: Diagnosis not present

## 2019-02-11 DIAGNOSIS — B3731 Acute candidiasis of vulva and vagina: Secondary | ICD-10-CM

## 2019-02-11 DIAGNOSIS — E669 Obesity, unspecified: Secondary | ICD-10-CM

## 2019-02-11 MED ORDER — BUPROPION HCL ER (XL) 300 MG PO TB24: 300.0000 mg | ORAL_TABLET | Freq: Every day | ORAL | 1 refills | Status: DC

## 2019-02-11 MED ORDER — FLUCONAZOLE 150 MG PO TABS
ORAL_TABLET | ORAL | 0 refills | Status: DC
Start: 2019-02-11 — End: 2020-04-25

## 2019-02-11 NOTE — Progress Notes (Signed)
Patient ID: Tracy Jacobs, female   DOB: Jan 24, 1971, 48 y.o.   MRN: [id number]    Virtual Visit via video Note  This visit type was conducted due to national recommendations for restrictions regarding the COVID-19 pandemic (e.g. social distancing).  This format is felt to be most appropriate for this patient at this time.  All issues noted in this document were discussed and addressed.  No physical exam was performed (except for noted visual exam findings with Video Visits).   I connected with Leonarda Salon today at  3:20 PM EDT by a video enabled telemedicine application and verified that I am speaking with the correct person using two identifiers. Location patient: home Location provider: work or home office Persons participating in the virtual visit: patient, provider  I discussed the limitations, risks, security and privacy concerns of performing an evaluation and management service by video and the availability of in person appointments. I also discussed with the patient that there may be a patient responsible charge related to this service. The patient expressed understanding and agreed to proceed.   HPI:  Patient and I connected via video to discuss possible yeast infection.  Patient states she has been swimming more in pool recently and believes that is what set off the vaginal itching and thin white vaginal discharge.  Denies any issues with urination, denies abdominal pain, nausea vomiting or diarrhea.  Denies any concerns for STD.   ROS: See pertinent positives and negatives per HPI.  Past Medical History:  Diagnosis Date  . Chicken pox   . Kidney stones     Past Surgical History:  Procedure Laterality Date  . ABDOMINAL HYSTERECTOMY    . CESAREAN SECTION     3 times    Family History  Problem Relation Age of Onset  . Breast cancer Sister 81  . Alcohol abuse Mother   . Alcohol abuse Father   . Colon cancer Paternal Aunt 42   Social History   Tobacco Use   . Smoking status: Never Smoker  . Smokeless tobacco: Never Used  Substance Use Topics  . Alcohol use: No    Current Outpatient Medications:  .  buPROPion (WELLBUTRIN XL) 150 MG 24 hr tablet, Start 150 mg ER PO qam, increase after 3 days to 300 mg qam., Disp: 60 tablet, Rfl: 3 .  buPROPion (WELLBUTRIN XL) 300 MG 24 hr tablet, Take 1 tablet (300 mg total) by mouth daily., Disp: 90 tablet, Rfl: 1 .  meloxicam (MOBIC) 15 MG tablet, , Disp: , Rfl:  .  fluconazole (DIFLUCAN) 150 MG tablet, Take 1st tablet on day 1. Take 2nd tablet on day 4, Disp: 2 tablet, Rfl: 0  EXAM:  GENERAL: alert, oriented, appears well and in no acute distress  HEENT: atraumatic, conjunttiva clear, no obvious abnormalities on inspection of external nose and ears  NECK: normal movements of the head and neck  LUNGS: on inspection no signs of respiratory distress, breathing rate appears normal, no obvious gross SOB, gasping or wheezing  CV: no obvious cyanosis  MS: moves all visible extremities without noticeable abnormality  PSYCH/NEURO: pleasant and cooperative, no obvious depression or anxiety, speech and thought processing grossly intact  ASSESSMENT AND PLAN:  Discussed the following assessment and plan:  Vaginal Candida - patient symptoms do sound consistent with a vaginal yeast infection.  She will take Diflucan course.  Also advised to wear cotton underwear.  Advised patient that if symptoms do not improve with Diflucan course to call  office and let us know and at that time we will have her come into clinic for pelvic exam.   I discussed the assessment and treatment plan with the patient. The patient was provided an opportunity to ask questions and all were answered. The patient agreed with the plan and demonstrated an understanding of the instructions.   The patient was advised to call back or seek an in-person evaluation if the symptoms worsen or if the condition fails to improve as anticipated.    Jodelle Green, FNP

## 2019-05-23 ENCOUNTER — Encounter: Payer: Self-pay | Admitting: Family

## 2019-05-23 DIAGNOSIS — Z1231 Encounter for screening mammogram for malignant neoplasm of breast: Secondary | ICD-10-CM

## 2019-05-23 NOTE — Telephone Encounter (Signed)
Screening mammo ordered

## 2019-05-30 ENCOUNTER — Ambulatory Visit
Admission: RE | Admit: 2019-05-30 | Discharge: 2019-05-30 | Disposition: A | Discharge status: Home / Self Care | Payer: Managed Care, Other (non HMO) | Source: Ambulatory Visit | Attending: Family Medicine | Admitting: Family Medicine

## 2019-05-30 DIAGNOSIS — Z1231 Encounter for screening mammogram for malignant neoplasm of breast: Secondary | ICD-10-CM | POA: Diagnosis not present

## 2019-07-18 ENCOUNTER — Ambulatory Visit: Payer: Managed Care, Other (non HMO) | Admitting: Family

## 2020-04-23 ENCOUNTER — Other Ambulatory Visit: Payer: Self-pay

## 2020-04-25 ENCOUNTER — Encounter: Payer: Self-pay | Admitting: Family

## 2020-04-25 ENCOUNTER — Other Ambulatory Visit: Payer: Self-pay

## 2020-04-25 ENCOUNTER — Ambulatory Visit (INDEPENDENT_AMBULATORY_CARE_PROVIDER_SITE_OTHER): Payer: Managed Care, Other (non HMO) | Admitting: Family

## 2020-04-25 VITALS — BP 116/66 | HR 80 | Temp 98.3°F | Ht 63.0 in | Wt 232.4 lb

## 2020-04-25 DIAGNOSIS — E669 Obesity, unspecified: Secondary | ICD-10-CM

## 2020-04-25 DIAGNOSIS — Z0000 Encounter for general adult medical examination without abnormal findings: Secondary | ICD-10-CM

## 2020-04-25 DIAGNOSIS — N898 Other specified noninflammatory disorders of vagina: Secondary | ICD-10-CM

## 2020-04-25 DIAGNOSIS — Z23 Encounter for immunization: Secondary | ICD-10-CM | POA: Diagnosis not present

## 2020-04-25 MED ORDER — FLUCONAZOLE 150 MG PO TABS: 150.0000 mg | ORAL_TABLET | Freq: Once | ORAL | 1 refills | Status: AC

## 2020-04-25 MED ORDER — BUPROPION HCL ER (XL) 150 MG PO TB24: ORAL_TABLET | ORAL | 3 refills | Status: DC

## 2020-04-25 NOTE — Patient Instructions (Addendum)
Start diflucan for suspected yeast infection  Start weight watchers again  Restart wellbutrin 150mg  tablet and follow instructions to increase.   Your healthy cholesterol is low so I would tell you to eat more healthy 'fats' such as avocados, nuts, beans, and olive oil in moderation of course.   Call me when ready for colonoscopy referral  Please call  and schedule your 3D mammogram as discussed.   [place]  [employer], [place]     Health Maintenance for Postmenopausal Women Menopause is a normal process in which your ability to get pregnant comes to an end. This process happens slowly over many months or years, usually between the ages of 47 and 35. Menopause is complete when you have missed your menstrual periods for 12 months. It is important to talk with your health care provider about some of the most common conditions that affect women after menopause (postmenopausal women). These include heart disease, cancer, and bone loss (osteoporosis). Adopting a healthy lifestyle and getting preventive care can help to promote your health and wellness. The actions you take can also lower your chances of developing some of these common conditions. What should I know about menopause? During menopause, you may get a number of symptoms, such as:  Hot flashes. These can be moderate or severe.  Night sweats.  Decrease in sex drive.  Mood swings.  Headaches.  Tiredness.  Irritability.  Memory problems.  Insomnia. Choosing to treat or not to treat these symptoms is a decision that you make with your health care provider. Do I need hormone replacement therapy?  Hormone replacement therapy is effective in treating symptoms that are caused by menopause, such as hot flashes and night sweats.  Hormone replacement carries certain risks, especially as you become older. If you are thinking about using estrogen or estrogen with  progestin, discuss the benefits and risks with your health care provider. What is my risk for heart disease and stroke? The risk of heart disease, heart attack, and stroke increases as you age. One of the causes may be a change in the body's hormones during menopause. This can affect how your body uses dietary fats, triglycerides, and cholesterol. Heart attack and stroke are medical emergencies. There are many things that you can do to help prevent heart disease and stroke. Watch your blood pressure  High blood pressure causes heart disease and increases the risk of stroke. This is more likely to develop in people who have high blood pressure readings, are of African descent, or are overweight.  Have your blood pressure checked: ? Every 3-5 years if you are 10-64 years of age. ? Every year if you are 67 years old or older. Eat a healthy diet   Eat a diet that includes plenty of vegetables, fruits, low-fat dairy products, and lean protein.  Do not eat a lot of foods that are high in solid fats, added sugars, or sodium. Get regular exercise Get regular exercise. This is one of the most important things you can do for your health. Most adults should:  Try to exercise for at least 150 minutes each week. The exercise should increase your heart rate and make you sweat (moderate-intensity exercise).  Try to do strengthening exercises at least twice each week. Do these in addition to the moderate-intensity exercise.  Spend less time sitting. Even light physical activity can be beneficial. Other tips  Work with your health care provider to achieve or maintain  a healthy weight.  Do not use any products that contain nicotine or tobacco, such as cigarettes, e-cigarettes, and chewing tobacco. If you need help quitting, ask your health care provider.  Know your numbers. Ask your health care provider to check your cholesterol and your blood sugar (glucose). Continue to have your blood tested as  directed by your health care provider. Do I need screening for cancer? Depending on your health history and family history, you may need to have cancer screening at different stages of your life. This may include screening for:  Breast cancer.  Cervical cancer.  Lung cancer.  Colorectal cancer. What is my risk for osteoporosis? After menopause, you may be at increased risk for osteoporosis. Osteoporosis is a condition in which bone destruction happens more quickly than new bone creation. To help prevent osteoporosis or the bone fractures that can happen because of osteoporosis, you may take the following actions:  If you are 20-59 years old, get at least 1,000 mg of calcium and at least 600 mg of vitamin D per day.  If you are older than age 46 but younger than age 62, get at least 1,200 mg of calcium and at least 600 mg of vitamin D per day.  If you are older than age 84, get at least 1,200 mg of calcium and at least 800 mg of vitamin D per day. Smoking and drinking excessive alcohol increase the risk of osteoporosis. Eat foods that are rich in calcium and vitamin D, and do weight-bearing exercises several times each week as directed by your health care provider. How does menopause affect my mental health? Depression may occur at any age, but it is more common as you become older. Common symptoms of depression include:  Low or sad mood.  Changes in sleep patterns.  Changes in appetite or eating patterns.  Feeling an overall lack of motivation or enjoyment of activities that you previously enjoyed.  Frequent crying spells. Talk with your health care provider if you think that you are experiencing depression. General instructions See your health care provider for regular wellness exams and vaccines. This may include:  Scheduling regular health, dental, and eye exams.  Getting and maintaining your vaccines. These include: ? Influenza vaccine. Get this vaccine each year before the  flu season begins. ? Pneumonia vaccine. ? Shingles vaccine. ? Tetanus, diphtheria, and pertussis (Tdap) booster vaccine. Your health care provider may also recommend other immunizations. Tell your health care provider if you have ever been abused or do not feel safe at home. Summary  Menopause is a normal process in which your ability to get pregnant comes to an end.  This condition causes hot flashes, night sweats, decreased interest in sex, mood swings, headaches, or lack of sleep.  Treatment for this condition may include hormone replacement therapy.  Take actions to keep yourself healthy, including exercising regularly, eating a healthy diet, watching your weight, and checking your blood pressure and blood sugar levels.  Get screened for cancer and depression. Make sure that you are up to date with all your vaccines. This information is not intended to replace advice given to you by your health care provider. Make sure you discuss any questions you have with your health care provider. Document Revised: 06/16/2018 Document Reviewed: 06/16/2018 Elsevier Patient Education  2020 [employer].

## 2020-04-25 NOTE — Addendum Note (Signed)
Addended by: Cheri Rous E on: 04/25/2020 11:55 AM   Modules accepted: Orders

## 2020-04-25 NOTE — Assessment & Plan Note (Signed)
Restart wellbutrin 300mg  and weight watchers. If no improvement in weight, we discussed saxenda trial. Close follow up.

## 2020-04-25 NOTE — Assessment & Plan Note (Signed)
CBE performed. No cervix on exam and we agreed to discontinue cervical cancer screening. tdap and influenza given. Encouraged to start walking program.

## 2020-04-25 NOTE — Assessment & Plan Note (Signed)
Symptoms consistent with candida. Start diflucan. If recurs , will refer to GYN

## 2020-04-25 NOTE — Progress Notes (Signed)
Subjective:    Patient ID: Tracy Jacobs, female    DOB: 12/24/1970, 49 y.o.   MRN: [id number]  CC: MADALEINE SIMMON is a 49 y.o. female who presents today for physical exam.    HPI: Has form for BMI to be completed for employer.   Had stopped wellbutrin which was helpful in the past in curbing appetite. She plans to resume weight watchers again.   No h/o seizure. No alcohol use. No h/o anorexia, bulimia. No depression  Complaints of recurrent yeast infection and vaginal itching. She has about 3 this calendar year. No foul odor, purulent discharge, dysuria, vaginal bleeding. Dryness during intercourse. No pelvic pain.       Colorectal Cancer Screening: due Breast Cancer Screening: due; sister had breast cancer Cervical Cancer Screening: due; hysterectomy Bone Health screening/DEXA for 65+: No increased fracture risk. Defer screening at this time.  Lung Cancer Screening: Doesn't have 30 year pack year history and age > 62 years yo 89 years  No family history of AAA.        Tetanus - due       Hepatitis C screening - Candidate for; declines.   Labs: Screening labs done prior with [facility]. Declines further labs.  Exercise: no regular exercise.   Alcohol use:  none Smoking/tobacco use: Nonsmoker.     HISTORY:  Past Medical History:  Diagnosis Date  . Chicken pox   . Kidney stones     Past Surgical History:  Procedure Laterality Date  . ABDOMINAL HYSTERECTOMY     NO CERVIX on exam 04/25/2020  . CESAREAN SECTION     3 times   Family History  Problem Relation Age of Onset  . Breast cancer Sister 63  . Alcohol abuse Mother   . Alcohol abuse Father   . Colon cancer Paternal Aunt 46      ALLERGIES: Pollen extract  No current outpatient medications on file prior to visit.   No current facility-administered medications on file prior to visit.    Social History   Tobacco Use  . Smoking status: Never Smoker  . Smokeless tobacco: Never Used  Substance Use  Topics  . Alcohol use: No  . Drug use: No    Review of Systems  Constitutional: Negative for chills, fever and unexpected weight change.  HENT: Negative for congestion.   Respiratory: Negative for cough.   Cardiovascular: Negative for chest pain, palpitations and leg swelling.  Gastrointestinal: Negative for nausea and vomiting.  Genitourinary: Positive for vaginal discharge. Negative for difficulty urinating, menstrual problem, pelvic pain and vaginal pain.  Musculoskeletal: Negative for arthralgias and myalgias.  Skin: Negative for rash.  Neurological: Negative for headaches.  Hematological: Negative for adenopathy.  Psychiatric/Behavioral: Negative for confusion.      Objective:    BP 116/66   Pulse 80   Temp 98.3 F (36.8 C)   Ht 5\' 3"  (1.6 m)   Wt 232 lb 6.4 oz (105.4 kg)   SpO2 98%   BMI 41.17 kg/m   BP Readings from Last 3 Encounters:  04/25/20 116/66  07/02/18 118/86  05/25/18 102/66   Wt Readings from Last 3 Encounters:  04/25/20 232 lb 6.4 oz (105.4 kg)  07/02/18 203 lb 3.2 oz (92.2 kg)  05/25/18 200 lb 9.6 oz (91 kg)    Physical Exam Vitals reviewed.  Constitutional:      Appearance: She is well-developed.  Eyes:     Conjunctiva/sclera: Conjunctivae normal.  Neck:  Thyroid: No thyroid mass or thyromegaly.  Cardiovascular:     Rate and Rhythm: Normal rate and regular rhythm.     Pulses: Normal pulses.     Heart sounds: Normal heart sounds.  Pulmonary:     Effort: Pulmonary effort is normal.     Breath sounds: Normal breath sounds. No wheezing, rhonchi or rales.  Chest:     Breasts: Breasts are symmetrical.        Right: No inverted nipple, mass, nipple discharge, skin change or tenderness.        Left: No inverted nipple, mass, nipple discharge, skin change or tenderness.  Genitourinary:    Labia:        Right: No rash, tenderness or lesion.        Left: No rash, tenderness or lesion.      Vagina: No foreign body. No vaginal discharge,  erythema, tenderness or bleeding.     Cervix: No cervical motion tenderness or discharge.     Adnexa:        Right: No mass, tenderness or fullness.         Left: No mass, tenderness or fullness.       Comments: No cervix appreciated on exam.   vulvovaginal erythema. No lesions. Moderate discharge which is thick and white. Discharge is not purulent.  Lymphadenopathy:     Head:     Right side of head: No submental, submandibular, tonsillar, preauricular, posterior auricular or occipital adenopathy.     Left side of head: No submental, submandibular, tonsillar, preauricular, posterior auricular or occipital adenopathy.     Cervical: No cervical adenopathy.     Right cervical: No superficial, deep or posterior cervical adenopathy.    Left cervical: No superficial, deep or posterior cervical adenopathy.  Skin:    General: Skin is warm and dry.  Neurological:     Mental Status: She is alert.  Psychiatric:        Speech: Speech normal.        Behavior: Behavior normal.        Thought Content: Thought content normal.        Assessment & Plan:   Problem List Items Addressed This Visit      Musculoskeletal and Integument   Vaginal itching    Symptoms consistent with candida. Start diflucan. If recurs , will refer to GYN      Relevant Medications   fluconazole (DIFLUCAN) 150 MG tablet   Other Relevant Orders   NuSwab VG, Candida 6sp     Other   Obesity    Restart wellbutrin 300mg  and weight watchers. If no improvement in weight, we discussed saxenda trial. Close follow up.       Relevant Medications   buPROPion (WELLBUTRIN XL) 150 MG 24 hr tablet   Routine physical examination - Primary    CBE performed. No cervix on exam and we agreed to discontinue cervical cancer screening. tdap and influenza given. Encouraged to start walking program.       Relevant Orders   MM 3D SCREEN BREAST BILATERAL   IGP, Aptima HPV       I have discontinued Ivin Booty L. Olena Heckle "Tracy Jacobs"'s  meloxicam, buPROPion, buPROPion, and fluconazole. I am also having her start on buPROPion and fluconazole.   Meds ordered this encounter  Medications  . buPROPion (WELLBUTRIN XL) 150 MG 24 hr tablet    Sig: Start 150 mg ER PO qam, increase after 3 days to 300 mg qam.    Dispense:  60 tablet    Refill:  3    Order Specific Question:   Supervising Provider    Answer:   Deborra Medina L [[id number]]  . fluconazole (DIFLUCAN) 150 MG tablet    Sig: Take 1 tablet (150 mg total) by mouth once for 1 dose. Take one tablet PO once. If sxs persist, may take one tablet PO 3 days later.    Dispense:  2 tablet    Refill:  1    Order Specific Question:   Supervising Provider    Answer:   Crecencio Mc [[id number]]    Return precautions given.   Risks, benefits, and alternatives of the medications and treatment plan prescribed today were discussed, and patient expressed understanding.   Education regarding symptom management and diagnosis given to patient on AVS.   Continue to follow with Burnard Hawthorne, FNP for routine health maintenance.   Tracy Jacobs and I agreed with plan.   Mable Paris, FNP

## 2020-04-28 LAB — NUSWAB VG, CANDIDA 6SP
Candida albicans, NAA: POSITIVE — AB
Candida glabrata, NAA: NEGATIVE
Trich vag by NAA: NEGATIVE

## 2020-04-29 LAB — NUSWAB VG, CANDIDA 6SP
Candida krusei, NAA: NEGATIVE
Candida lusitaniae, NAA: NEGATIVE

## 2020-05-01 LAB — IGP, APTIMA HPV: HPV Aptima: NEGATIVE

## 2020-05-02 ENCOUNTER — Encounter: Payer: Self-pay | Admitting: Family

## 2020-06-07 ENCOUNTER — Ambulatory Visit
Admission: RE | Admit: 2020-06-07 | Discharge: 2020-06-07 | Disposition: A | Discharge status: Home / Self Care | Payer: Managed Care, Other (non HMO) | Source: Ambulatory Visit | Attending: Family | Admitting: Family

## 2020-06-07 ENCOUNTER — Other Ambulatory Visit: Payer: Self-pay

## 2020-06-07 DIAGNOSIS — Z1231 Encounter for screening mammogram for malignant neoplasm of breast: Secondary | ICD-10-CM | POA: Insufficient documentation

## 2020-06-07 DIAGNOSIS — Z0000 Encounter for general adult medical examination without abnormal findings: Secondary | ICD-10-CM | POA: Diagnosis present

## 2020-07-13 ENCOUNTER — Other Ambulatory Visit: Payer: Self-pay | Admitting: Family

## 2020-07-27 ENCOUNTER — Ambulatory Visit: Payer: Managed Care, Other (non HMO) | Admitting: Family

## 2020-08-31 ENCOUNTER — Ambulatory Visit: Payer: Managed Care, Other (non HMO) | Admitting: Family

## 2021-04-24 ENCOUNTER — Other Ambulatory Visit: Payer: Self-pay

## 2021-04-24 ENCOUNTER — Encounter: Payer: Self-pay | Admitting: Family

## 2021-04-24 ENCOUNTER — Ambulatory Visit (INDEPENDENT_AMBULATORY_CARE_PROVIDER_SITE_OTHER): Payer: Managed Care, Other (non HMO) | Admitting: Family

## 2021-04-24 VITALS — BP 124/95 | HR 68 | Temp 95.7°F | Ht 62.6 in | Wt 237.8 lb

## 2021-04-24 DIAGNOSIS — E669 Obesity, unspecified: Secondary | ICD-10-CM

## 2021-04-24 DIAGNOSIS — Z0000 Encounter for general adult medical examination without abnormal findings: Secondary | ICD-10-CM

## 2021-04-24 DIAGNOSIS — E785 Hyperlipidemia, unspecified: Secondary | ICD-10-CM | POA: Diagnosis not present

## 2021-04-24 DIAGNOSIS — Z23 Encounter for immunization: Secondary | ICD-10-CM | POA: Diagnosis not present

## 2021-04-24 DIAGNOSIS — R030 Elevated blood-pressure reading, without diagnosis of hypertension: Secondary | ICD-10-CM | POA: Insufficient documentation

## 2021-04-24 NOTE — Assessment & Plan Note (Signed)
Counseled on importance of low glycemic diet.

## 2021-04-24 NOTE — Progress Notes (Signed)
Subjective:    Patient ID: Tracy Jacobs, female    DOB: July 15, 1970, 50 y.o.   MRN: [id number]  CC: Tracy Jacobs is a 50 y.o. female who presents today for physical exam.    HPI: Feels well today  No complaints Planning to start health and wellness program with [facility] to focus on weight loss.  She is no history of hypertension neuropathy on any antihypertensive medication.  No NSAID, sudafed use.  Colorectal Cancer Screening: due Breast Cancer Screening: Mammogram UTD Cervical Cancer Screening: History of hysterectomy.  Pelvic exam performed last year for evaluation of cervix.  No cervix on exam 04/25/2020.  Pap smear negative for malignancy, HPV and without endocervical cells.  No pelvic pain, vaginal bleeding. Bone Health screening/DEXA for 65+: No increased fracture risk. Defer screening at this time.         Tetanus - UTD        Hepatitis C screening - Candidate for, consents Labs: Screening labs prior with work Exercise: No regular exercise.   Alcohol use:  none Smoking/tobacco use: Nonsmoker.     HISTORY:  Past Medical History:  Diagnosis Date   Chicken pox    Kidney stones     Past Surgical History:  Procedure Laterality Date   ABDOMINAL HYSTERECTOMY     NO CERVIX on exam 04/25/2020 nor endocervical cells on Pap 04/25/20   CESAREAN SECTION     3 times   Family History  Problem Relation Age of Onset   Breast cancer Sister 66   Alcohol abuse Mother    Alcohol abuse Father    Colon cancer Paternal Aunt 54      ALLERGIES: Pollen extract  No current outpatient medications on file prior to visit.   No current facility-administered medications on file prior to visit.    Social History   Tobacco Use   Smoking status: Never   Smokeless tobacco: Never  Substance Use Topics   Alcohol use: No   Drug use: No    Review of Systems  Constitutional:  Negative for chills, fever and unexpected weight change.  HENT:  Negative for congestion.    Respiratory:  Negative for cough.   Cardiovascular:  Negative for chest pain, palpitations and leg swelling.  Gastrointestinal:  Negative for nausea and vomiting.  Genitourinary:  Negative for pelvic pain.  Musculoskeletal:  Negative for arthralgias and myalgias.  Skin:  Negative for rash.  Neurological:  Negative for headaches.  Hematological:  Negative for adenopathy.  Psychiatric/Behavioral:  Negative for confusion.      Objective:    BP (!) 124/95   Pulse 68   Temp (!) 95.7 F (35.4 C)   Ht 5' 2.6" (1.59 m)   Wt 237 lb 12.8 oz (107.9 kg)   SpO2 96%   BMI 42.67 kg/m   BP Readings from Last 3 Encounters:  04/24/21 (!) 124/95  04/25/20 116/66  07/02/18 118/86   Wt Readings from Last 3 Encounters:  04/24/21 237 lb 12.8 oz (107.9 kg)  04/25/20 232 lb 6.4 oz (105.4 kg)  07/02/18 203 lb 3.2 oz (92.2 kg)    Physical Exam Vitals reviewed.  Constitutional:      Appearance: Normal appearance. She is well-developed.  Eyes:     Conjunctiva/sclera: Conjunctivae normal.  Neck:     Thyroid: No thyroid mass or thyromegaly.  Cardiovascular:     Rate and Rhythm: Normal rate and regular rhythm.     Pulses: Normal pulses.     Heart  sounds: Normal heart sounds.  Pulmonary:     Effort: Pulmonary effort is normal.     Breath sounds: Normal breath sounds. No wheezing, rhonchi or rales.  Chest:  Breasts:    Breasts are symmetrical.     Right: No inverted nipple, mass, nipple discharge, skin change or tenderness.     Left: No inverted nipple, mass, nipple discharge, skin change or tenderness.  Abdominal:     General: Bowel sounds are normal. There is no distension.     Palpations: Abdomen is soft. Abdomen is not rigid. There is no fluid wave or mass.     Tenderness: There is no abdominal tenderness. There is no guarding or rebound.  Lymphadenopathy:     Head:     Right side of head: No submental, submandibular, tonsillar, preauricular, posterior auricular or occipital  adenopathy.     Left side of head: No submental, submandibular, tonsillar, preauricular, posterior auricular or occipital adenopathy.     Cervical: No cervical adenopathy.     Right cervical: No superficial, deep or posterior cervical adenopathy.    Left cervical: No superficial, deep or posterior cervical adenopathy.  Skin:    General: Skin is warm and dry.  Neurological:     Mental Status: She is alert.  Psychiatric:        Speech: Speech normal.        Behavior: Behavior normal.        Thought Content: Thought content normal.       Assessment & Plan:   Problem List Items Addressed This Visit       Other   HLD (hyperlipidemia)    Calculated ASCVD risk over 10 years 5.5%. Discussed importance of maintaining healthy weigh and incorporating exercise. We will continue to monitor.       Obesity    Counseled on importance of low glycemic diet.       Routine physical examination - Primary    Clinical breast exam performed today.  Mammogram ordered and patient may schedule. deferred pelvic exam in the absence of complaints and she has hysterectomy without cervix. Encouraged walking program. She declines screening for colon cancer and will let me know if she decides to do this.       Relevant Orders   CBC w/Diff   Comp Met (CMET)   Hepatitis C antibody   TSH   Vitamin D (25 hydroxy)   MM 3D SCREEN BREAST BILATERAL     I have discontinued Tracy Booty L. Olena Heckle "Lea"'s buPROPion and buPROPion.   No orders of the defined types were placed in this encounter.   Return precautions given.   Risks, benefits, and alternatives of the medications and treatment plan prescribed today were discussed, and patient expressed understanding.   Education regarding symptom management and diagnosis given to patient on AVS.   Continue to follow with Tracy Hawthorne, FNP for routine health maintenance.   Tracy Jacobs and I agreed with plan.   Tracy Paris, FNP

## 2021-04-24 NOTE — Assessment & Plan Note (Addendum)
Clinical breast exam performed today.  Mammogram ordered and patient may schedule. deferred pelvic exam in the absence of complaints and she has hysterectomy without cervix. Encouraged walking program. She declines screening for colon cancer and will let me know if she decides to do this.

## 2021-04-24 NOTE — Assessment & Plan Note (Addendum)
Calculated ASCVD risk over 10 years 5.5%. Discussed importance of maintaining healthy weigh and incorporating exercise. We will continue to monitor.

## 2021-04-24 NOTE — Assessment & Plan Note (Signed)
Elevated today however improved as patient rested in room.  Advised patient to monitor blood pressure at home.  She will call me if persistently elevated

## 2021-04-24 NOTE — Patient Instructions (Addendum)
It is imperative that you are seen AT least twice per year for labs and monitoring. Monitor blood pressure at home and me 5-6 reading on separate days. Goal is less than 120/80, based on newest guidelines, however we certainly want to be less than 130/80;  if persistently higher, please make sooner follow up appointment so we can recheck you blood pressure and manage/ adjust medications.  We can discuss mounjaro at follow up if you would like.  This is  Dr. Lupita Dawn  example of a  "Low GI"  Diet:  It will allow you to lose 4 to 8  lbs  per month if you follow it carefully.  Your goal with exercise is a minimum of 30 minutes of aerobic exercise 5 days per week (Walking does not count once it becomes easy!)    All of the foods can be found at grocery stores and in bulk at [employer].  The Atkins protein bars and shakes are available in more varieties at [employer], [employer] and [place].     7 AM Breakfast:  Choose from the following:  Low carbohydrate Protein  Shakes (I recommend the  Premier Protein chocolate shakes,  EAS AdvantEdge "Carb Control" shakes  Or the Atkins shakes all are under 3 net carbs)     a scrambled egg/bacon/cheese burrito made with [place]'s "carb balance" whole wheat tortilla  (about 10 net carbs )  Regulatory affairs officer (basically a quiche without the pastry crust) that is eaten cold and very convenient way to get your eggs.  8 carbs)  If you make your own protein shakes, avoid bananas and pineapple,  And use low carb greek yogurt or original /unsweetened almond or soy milk    Avoid cereal and bananas, oatmeal and cream of wheat and grits. They are loaded with carbohydrates!   10 AM: high protein snack:  Protein bar by Atkins (the snack size, under 200 cal, usually < 6 net carbs).    A stick of cheese:  Around 1 carb,  100 cal     Dannon Light n Fit [place] Yogurt  (80 cal, 8 carbs)  Other so called "protein bars" and Greek yogurts tend to be loaded with  carbohydrates.  Remember, in food advertising, the word "energy" is synonymous for " carbohydrate."  Lunch:   A Sandwich using the bread choices listed, Can use any  Eggs,  lunchmeat, grilled meat or canned tuna), avocado, regular mayo/mustard  and cheese.  A Salad using blue cheese, ranch,  Goddess or vinagrette,  Avoid taco shells, croutons or "confetti" and no "candied nuts" but regular nuts OK.   No pretzels, nabs  or chips.  Pickles and miniature sweet peppers are a good low carb alternative that provide a "crunch"  The bread is the only source of carbohydrate in a sandwich and  can be decreased by trying some of the attached alternatives to traditional loaf bread   Avoid "Low fat dressings, as well as [place] dressings They are loaded with sugar!   3 PM/ Mid day  Snack:  Consider  1 ounce of  almonds, walnuts, pistachios, pecans, peanuts,  Macadamia nuts or a nut medley.  Avoid "granola and granola bars "  Mixed nuts are ok in moderation as long as there are no raisins,  cranberries or dried fruit.   KIND bars are OK if you get the low glycemic index variety   Try the prosciutto/mozzarella cheese sticks by [employer]  In deli /backery section   High protein      6 PM  Dinner:     Meat/fowl/fish with a green salad, and either broccoli, cauliflower, green beans, spinach, brussel sprouts or  Lima beans. DO NOT BREAD THE PROTEIN!!      There is a low carb pasta by Dreamfield's that is acceptable and tastes great: only 5 digestible carbs/serving.( All grocery stores but BJs carry it ) Several ready made meals are available low carb:   Try Michel Angelo's chicken piccata or chicken or eggplant parm over low carb pasta.(Lowes and BJs)   Marjory Lies Sanchez's "Carnitas" (pulled pork, no sauce,  0 carbs) or his beef pot roast to make a dinner burrito (at [employer]J's)  Pesto over low carb pasta (bj's sells a good quality pesto in the center refrigerated section of the deli   Try satueeing   Cheral Marker with mushroooms as a good side   [employer] Giant makes a mashed cauliflower that tastes like mashed potatoes  Whole wheat pasta is still full of digestible carbs and  Not as low in glycemic index as Dreamfield's.   Brown rice is still rice,  So skip the rice and noodles if you eat [place] or [place] (or at least limit to 1/2 cup)  9 PM snack :   Breyer's "low carb" fudgsicle or  ice cream bar (Carb Smart line), or  Weight Watcher's ice cream bar , or another "no sugar added" ice cream;  a serving of fresh berries/cherries with whipped cream   Cheese or DANNON'S LlGHT N FIT GREEK YOGURT  8 ounces of [place] unsweetened almond/cococunut milk    Treat yourself to a parfait made with whipped cream blueberiies, walnuts and vanilla greek yogurt  Avoid bananas, pineapple, grapes  and watermelon on a regular basis because they are high in sugar.  THINK OF THEM AS DESSERT  Remember that snack Substitutions should be less than 10 NET carbs per serving and meals < 20 carbs. Remember to subtract fiber grams to get the "net carbs."

## 2021-04-30 ENCOUNTER — Telehealth: Payer: Self-pay | Admitting: Family

## 2021-04-30 DIAGNOSIS — R748 Abnormal levels of other serum enzymes: Secondary | ICD-10-CM

## 2021-04-30 LAB — COMPREHENSIVE METABOLIC PANEL
ALT: 44 IU/L — ABNORMAL HIGH (ref 0–32)
AST: 28 IU/L (ref 0–40)
Albumin/Globulin Ratio: 2.2 (ref 1.2–2.2)
Albumin: 4.9 g/dL (ref 3.8–4.9)
Alkaline Phosphatase: 126 IU/L — ABNORMAL HIGH (ref 44–121)
BUN/Creatinine Ratio: 22 (ref 9–23)
BUN: 17 mg/dL (ref 6–24)
Bilirubin Total: 0.4 mg/dL (ref 0.0–1.2)
CO2: 21 mmol/L (ref 20–29)
Calcium: 9 mg/dL (ref 8.7–10.2)
Chloride: 104 mmol/L (ref 96–106)
Creatinine, Ser: 0.78 mg/dL (ref 0.57–1.00)
Globulin, Total: 2.2 g/dL (ref 1.5–4.5)
Glucose: 93 mg/dL (ref 70–99)
Potassium: 4.6 mmol/L (ref 3.5–5.2)
Sodium: 142 mmol/L (ref 134–144)
Total Protein: 7.1 g/dL (ref 6.0–8.5)
eGFR: 92 mL/min/{1.73_m2} (ref >59)

## 2021-04-30 LAB — CBC WITH DIFFERENTIAL/PLATELET
Basophils Absolute: 0 10*3/uL (ref 0.0–0.2)
Basos: 1 %
EOS (ABSOLUTE): 0.1 10*3/uL (ref 0.0–0.4)
Eos: 2 %
Hematocrit: 43.6 % (ref 34.0–46.6)
Hemoglobin: 14.7 g/dL (ref 11.1–15.9)
Immature Grans (Abs): 0.1 10*3/uL (ref 0.0–0.1)
Immature Granulocytes: 1 %
Lymphocytes Absolute: 2.3 10*3/uL (ref 0.7–3.1)
Lymphs: 32 %
MCH: 30.7 pg (ref 26.6–33.0)
MCHC: 33.7 g/dL (ref 31.5–35.7)
MCV: 91 fL (ref 79–97)
Monocytes Absolute: 0.5 10*3/uL (ref 0.1–0.9)
Monocytes: 7 %
Neutrophils Absolute: 4.1 10*3/uL (ref 1.4–7.0)
Neutrophils: 57 %
Platelets: 204 10*3/uL (ref 150–450)
RBC: 4.79 x10E6/uL (ref 3.77–5.28)
RDW: 12.4 % (ref 11.7–15.4)
WBC: 7 10*3/uL (ref 3.4–10.8)

## 2021-04-30 LAB — HEPATITIS C ANTIBODY: Hep C Virus Ab: <0.1 s/co ratio (ref 0.0–0.9)

## 2021-04-30 LAB — VITAMIN D 25 HYDROXY (VIT D DEFICIENCY, FRACTURES): Vit D, 25-Hydroxy: 17 ng/mL — ABNORMAL LOW (ref 30.0–100.0)

## 2021-04-30 LAB — TSH: TSH: 1.94 u[IU]/mL (ref 0.450–4.500)

## 2021-04-30 NOTE — Telephone Encounter (Signed)
Add on request called & faxed for both tests.

## 2021-04-30 NOTE — Telephone Encounter (Signed)
Tracy Jacobs Can I add on alk phos, GGT labs from yesterday? If not, [name] please call patient and schedule her for these in the next 1 to 2 weeks

## 2021-05-01 NOTE — Telephone Encounter (Signed)
I faxed yesterday & confirmation received.

## 2021-05-02 ENCOUNTER — Other Ambulatory Visit: Payer: Self-pay | Admitting: Family

## 2021-05-04 LAB — ALKALINE PHOSPHATASE, ISOENZYMES
Alkaline Phosphatase: 123 IU/L — ABNORMAL HIGH (ref 44–121)
BONE FRACTION: 60 % (ref 14–68)
INTESTINAL FRAC.: 2 % (ref 0–18)
LIVER FRACTION: 38 % (ref 18–85)

## 2021-05-04 LAB — SPECIMEN STATUS REPORT

## 2021-05-04 LAB — GAMMA GT: GGT: 32 IU/L (ref 0–60)

## 2021-05-07 ENCOUNTER — Encounter: Payer: Self-pay | Admitting: Family

## 2021-05-08 ENCOUNTER — Encounter: Payer: Self-pay | Admitting: Family

## 2021-05-13 ENCOUNTER — Other Ambulatory Visit: Payer: Self-pay | Admitting: Family

## 2021-05-13 DIAGNOSIS — R748 Abnormal levels of other serum enzymes: Secondary | ICD-10-CM

## 2021-06-10 ENCOUNTER — Other Ambulatory Visit: Payer: Self-pay

## 2021-06-10 ENCOUNTER — Ambulatory Visit
Admission: RE | Admit: 2021-06-10 | Discharge: 2021-06-10 | Disposition: A | Discharge status: Home / Self Care | Payer: Managed Care, Other (non HMO) | Source: Ambulatory Visit | Attending: Family | Admitting: Family

## 2021-06-10 DIAGNOSIS — Z0000 Encounter for general adult medical examination without abnormal findings: Secondary | ICD-10-CM

## 2021-06-10 DIAGNOSIS — Z1231 Encounter for screening mammogram for malignant neoplasm of breast: Secondary | ICD-10-CM | POA: Diagnosis present

## 2021-09-11 ENCOUNTER — Telehealth: Payer: Managed Care, Other (non HMO) | Admitting: Family

## 2021-09-11 NOTE — Telephone Encounter (Signed)
Call pt ?Sch f/u to discuss elevation in alk phos ? ?Also, I do not think she saw result note from 04/29/22 upon chart review; please review with her ? ?

## 2021-09-19 ENCOUNTER — Telehealth: Payer: Self-pay

## 2021-09-19 NOTE — Telephone Encounter (Signed)
LMTCB to schedule follow-up appointment.  

## 2021-09-23 ENCOUNTER — Telehealth: Payer: Self-pay

## 2021-09-23 NOTE — Telephone Encounter (Signed)
LMTCB to office to discuss result notes from 04/2021. Also to schedule follow up appointment ?

## 2021-09-25 NOTE — Telephone Encounter (Signed)
Left my chart message.

## 2021-09-26 NOTE — Telephone Encounter (Signed)
Letter mailed to patient.

## 2022-06-10 ENCOUNTER — Ambulatory Visit (INDEPENDENT_AMBULATORY_CARE_PROVIDER_SITE_OTHER): Payer: Managed Care, Other (non HMO) | Admitting: Family

## 2022-06-10 ENCOUNTER — Encounter: Payer: Self-pay | Admitting: Family

## 2022-06-10 VITALS — BP 136/82 | HR 77 | Temp 97.9°F | Ht 63.0 in | Wt 243.2 lb

## 2022-06-10 DIAGNOSIS — E559 Vitamin D deficiency, unspecified: Secondary | ICD-10-CM

## 2022-06-10 DIAGNOSIS — E669 Obesity, unspecified: Secondary | ICD-10-CM | POA: Diagnosis not present

## 2022-06-10 DIAGNOSIS — Z23 Encounter for immunization: Secondary | ICD-10-CM | POA: Diagnosis not present

## 2022-06-10 DIAGNOSIS — Z0000 Encounter for general adult medical examination without abnormal findings: Secondary | ICD-10-CM

## 2022-06-10 DIAGNOSIS — R030 Elevated blood-pressure reading, without diagnosis of hypertension: Secondary | ICD-10-CM

## 2022-06-10 DIAGNOSIS — E785 Hyperlipidemia, unspecified: Secondary | ICD-10-CM

## 2022-06-10 MED ORDER — WEGOVY 0.25 MG/0.5ML ~~LOC~~ SOAJ: 0.2500 mg | SUBCUTANEOUS | 2 refills | Status: DC

## 2022-06-10 NOTE — Assessment & Plan Note (Deleted)
H/o hysterectomy   She has no cervix.   Pap smear of vagina 04/26/20 :   Your pap smear of your vagina is negative for malignancy and negative HPV. It is also indicative of NO CERVIX as no endocervical cells seen. You do not need further pap smears since you do not have cervix unless you have any concerns.

## 2022-06-10 NOTE — Assessment & Plan Note (Signed)
No history of prediabetes.  Discussed low glycemic diet.  Patient agreeable to trial of Wegovy if insurance will approve.  Counseled patient on blackbox warning as it relates to medullary thyroid cancer, multiple endocrine neoplasia.  Counseled patient on mechanism of action and side effects of Wegovy.  Close follow-up

## 2022-06-10 NOTE — Assessment & Plan Note (Signed)
Elevated today.  Counseled on low-sodium diet.  Patient will continue to monitor.  We are aggressively working on weight loss to see blood pressure approaches 120 /80.

## 2022-06-10 NOTE — Assessment & Plan Note (Signed)
Clinical breast exam performed today.  Deferred pelvic exam in absence of complaints and patient does not have a cervix as demonstrated on Pap smear 04/26/2020.  Patient will schedule mammogram.  Colonoscopy referred

## 2022-06-10 NOTE — Patient Instructions (Addendum)
Please call  and schedule your 3D mammogram and /or bone density scan as we discussed.   [facility]  ( new location in 2023)  [id number], [place], [place] [address]  [place], [place]  We have discussed starting non insulin daily injectable medication called [name]  which is a glucagon like peptide (GLP 1) agonist and works by delaying gastric emptying and increasing insulin secretion.It is given once per week. Most patients see significant weight loss with this drug class.   You may NOT take either medication if you or your family has history of thyroid, parathyroid, OR adrenal cancer. Please confirm you and your family does NOT have this history as this drug class has black box warning on this medication for that reason.   Advise to follow with directions on prescription and slowly increase from 0.'25mg'$  [place] once per week ;stay here for 4 weeks. You may then increase to 0.'5mg'$  [place] once per week and stay there for 4 weeks.  We can slowly titrate further at follow up with goal of no more than 1-2 lbs weight loss per week.  Please download Myfitness Pal App ( free). You may log every thing you eat for even 2-3 days to get a better of idea of true daily calories. To loose weight, you have to use more calories than than consumed and essentially create caloric deficit to loose weight. The goal is 1-2 lbs per week of weight loss.   You review below from [facility].   [email or web address].uk  Calorie counting made easy  Eat less, exercise more. If only it were that simple! As most dieters know, losing weight can be very challenging. As this report details, a range of influences can affect how people gain and lose weight. But a basic understanding of how to tip your energy balance in favor of weight loss is a good place to start.  Start by determining how many calories you should consume each day. To do so, you  need to know how many calories you need to maintain your current weight. Doing this requires a few simple calculations.  First, multiply your current weight by 15 -- that's roughly the number of calories per pound of body weight needed to maintain your current weight if you are moderately active. Moderately active means getting at least 30 minutes of physical activity a day in the form of exercise (walking at a brisk pace, climbing stairs, or active gardening). Let's say you're a woman who is 5 feet, 4 inches tall and weighs 155 pounds, and you need to lose about 15 pounds to put you in a healthy weight range. If you multiply 155 by 15, you will get 2,325, which is the number of calories per day that you need in order to maintain your current weight (weight-maintenance calories). To lose weight, you will need to get below that total.  For example, to lose 1 to 2 pounds a week -- a rate that experts consider safe -- your food consumption should provide 500 to 1,000 calories less than your total weight-maintenance calories. If you need 2,325 calories a day to maintain your current weight, reduce your daily calories to between 1,325 and 1,825. If you are sedentary, you will also need to build more activity into your day. In order to lose at least a pound a week, try to do at least 30 minutes of physical activity on most days, and reduce your daily calorie intake by at least  500 calories. However, calorie intake should not fall below 1,200 a day in women or 1,500 a day in men, except under the supervision of a health professional. Eating too few calories can endanger your health by depriving you of needed nutrients.  Meeting your calorie target How can you meet your daily calorie target? One approach is to add up the number of calories per serving of all the foods that you eat, and then plan your menus accordingly. You can buy books that list calories per serving for many foods. In addition, the nutrition labels  on all packaged foods and beverages provide calories per serving information. Make a point of reading the labels of the foods and drinks you use, noting the number of calories and the serving sizes. Many recipes published in cookbooks, newspapers, and magazines provide similar information.  If you hate counting calories, a different approach is to restrict how much and how often you eat, and to eat meals that are low in calories. Dietary guidelines issued by the American Heart Association stress common sense in choosing your foods rather than focusing strictly on numbers, such as total calories or calories from fat. Whichever method you choose, research shows that a regular eating schedule -- with meals and snacks planned for certain times each day -- makes for the most successful approach. The same applies after you have lost weight and want to keep it off. Sticking with an eating schedule increases your chance of maintaining your new weight.    This is  Dr. Lupita Dawn  ( an amazing physician in my office!)  example of a  "Low GI"  Diet:  It will allow you to lose 4 to 8  lbs  per month if you follow it carefully.  Your goal with exercise is a minimum of 30 minutes of aerobic exercise 5 days per week (Walking does not count once it becomes easy!)    All of the foods can be found at grocery stores and in bulk at [employer].  The Atkins protein bars and shakes are available in more varieties at [employer], [employer] and [employer].     7 AM Breakfast:  Choose from the following:  Low carbohydrate Protein  Shakes (I recommend the  Premier Protein chocolate shakes,  EAS AdvantEdge "Carb Control" shakes  Or the Atkins shakes all are under 3 net carbs)     a scrambled egg/bacon/cheese burrito made with Mission's "carb balance" whole wheat tortilla  (about 10 net carbs )  Regulatory affairs officer (basically a quiche without the pastry crust) that is eaten cold and very convenient way to get your  eggs.  8 carbs)  If you make your own protein shakes, avoid bananas and pineapple,  And use low carb greek yogurt or original /unsweetened almond or soy milk    Avoid cereal and bananas, oatmeal and cream of wheat and grits. They are loaded with carbohydrates!   10 AM: high protein snack:  Protein bar by Atkins (the snack size, under 200 cal, usually < 6 net carbs).    A stick of cheese:  Around 1 carb,  100 cal     Dannon Light n Fit [place] Yogurt  (80 cal, 8 carbs)  Other so called "protein bars" and Greek yogurts tend to be loaded with carbohydrates.  Remember, in food advertising, the word "energy" is synonymous for " carbohydrate."  Lunch:   A Sandwich using the bread choices listed, Can use any  Eggs,  lunchmeat, grilled meat or canned tuna), avocado, regular mayo/mustard  and cheese.  A Salad using blue cheese, ranch,  Goddess or vinagrette,  Avoid taco shells, croutons or "confetti" and no "candied nuts" but regular nuts OK.   No pretzels, nabs  or chips.  Pickles and miniature sweet peppers are a good low carb alternative that provide a "crunch"  The bread is the only source of carbohydrate in a sandwich and  can be decreased by trying some of the attached alternatives to traditional loaf bread   Avoid "Low fat dressings, as well as Hart dressings They are loaded with sugar!   3 PM/ Mid day  Snack:  Consider  1 ounce of  almonds, walnuts, pistachios, pecans, peanuts,  Macadamia nuts or a nut medley.  Avoid "granola and granola bars "  Mixed nuts are ok in moderation as long as there are no raisins,  cranberries or dried fruit.   KIND bars are OK if you get the low glycemic index variety   Try the prosciutto/mozzarella cheese sticks by Fior[employer]ci  In deli /backery section   High protein      6 PM  Dinner:     Meat/fowl/fish with a green salad, and either broccoli, cauliflower, green beans, spinach, brussel sprouts or  Lima beans. DO NOT BREAD THE PROTEIN!!       There is a low carb pasta by Dreamfield's that is acceptable and tastes great: only 5 digestible carbs/serving.( All grocery stores but BJs carry it ) Several ready made meals are available low carb:   Try Michel Angelo's chicken piccata or chicken or eggplant parm over low carb pasta.(Lowes and BJs)   Marjory Lies Sanchez's "Carnitas" (pulled pork, no sauce,  0 carbs) or his beef pot roast to make a dinner burrito (at [employer]J's)  Pesto over low carb pasta (bj's sells a good quality pesto in the center refrigerated section of the deli   Try satueeing  Cheral Marker with mushroooms as a good side   [employer] Giant makes a mashed cauliflower that tastes like mashed potatoes  Whole wheat pasta is still full of digestible carbs and  Not as low in glycemic index as Dreamfield's.   Brown rice is still rice,  So skip the rice and noodles if you eat [place] or [place] (or at least limit to 1/2 cup)  9 PM snack :   Breyer's "low carb" fudgsicle or  ice cream bar (Carb Smart line), or  Weight Watcher's ice cream bar , or another "no sugar added" ice cream;  a serving of fresh berries/cherries with whipped cream   Cheese or DANNON'S LlGHT N FIT GREEK YOGURT  8 ounces of [place] unsweetened almond/cococunut milk    Treat yourself to a parfait made with whipped cream blueberiies, walnuts and vanilla greek yogurt  Avoid bananas, pineapple, grapes  and watermelon on a regular basis because they are high in sugar.  THINK OF THEM AS DESSERT  Remember that snack Substitutions should be less than 10 NET carbs per serving and meals < 20 carbs. Remember to subtract fiber grams to get the "net carbs."  Health Maintenance for Postmenopausal Women Menopause is a normal process in which your ability to get pregnant comes to an end. This process happens slowly over many months or years, usually between the ages of 27 and 67. Menopause is complete when you have missed your menstrual period for 12 months. It is important to talk with  your health care  provider about some of the most common conditions that affect women after menopause (postmenopausal women). These include heart disease, cancer, and bone loss (osteoporosis). Adopting a healthy lifestyle and getting preventive care can help to promote your health and wellness. The actions you take can also lower your chances of developing some of these common conditions. What are the signs and symptoms of menopause? During menopause, you may have the following symptoms: Hot flashes. These can be moderate or severe. Night sweats. Decrease in sex drive. Mood swings. Headaches. Tiredness (fatigue). Irritability. Memory problems. Problems falling asleep or staying asleep. Talk with your health care provider about treatment options for your symptoms. Do I need hormone replacement therapy? Hormone replacement therapy is effective in treating symptoms that are caused by menopause, such as hot flashes and night sweats. Hormone replacement carries certain risks, especially as you become older. If you are thinking about using estrogen or estrogen with progestin, discuss the benefits and risks with your health care provider. How can I reduce my risk for heart disease and stroke? The risk of heart disease, heart attack, and stroke increases as you age. One of the causes may be a change in the body's hormones during menopause. This can affect how your body uses dietary fats, triglycerides, and cholesterol. Heart attack and stroke are medical emergencies. There are many things that you can do to help prevent heart disease and stroke. Watch your blood pressure High blood pressure causes heart disease and increases the risk of stroke. This is more likely to develop in people who have high blood pressure readings or are overweight. Have your blood pressure checked: Every 3-5 years if you are 65-45 years of age. Every year if you are 23 years old or older. Eat a healthy diet  Eat a diet that  includes plenty of vegetables, fruits, low-fat dairy products, and lean protein. Do not eat a lot of foods that are high in solid fats, added sugars, or sodium. Get regular exercise Get regular exercise. This is one of the most important things you can do for your health. Most adults should: Try to exercise for at least 150 minutes each week. The exercise should increase your heart rate and make you sweat (moderate-intensity exercise). Try to do strengthening exercises at least twice each week. Do these in addition to the moderate-intensity exercise. Spend less time sitting. Even light physical activity can be beneficial. Other tips Work with your health care provider to achieve or maintain a healthy weight. Do not use any products that contain nicotine or tobacco. These products include cigarettes, chewing tobacco, and vaping devices, such as e-cigarettes. If you need help quitting, ask your health care provider. Know your numbers. Ask your health care provider to check your cholesterol and your blood sugar (glucose). Continue to have your blood tested as directed by your health care provider. Do I need screening for cancer? Depending on your health history and family history, you may need to have cancer screenings at different stages of your life. This may include screening for: Breast cancer. Cervical cancer. Lung cancer. Colorectal cancer. What is my risk for osteoporosis? After menopause, you may be at increased risk for osteoporosis. Osteoporosis is a condition in which bone destruction happens more quickly than new bone creation. To help prevent osteoporosis or the bone fractures that can happen because of osteoporosis, you may take the following actions: If you are 8-85 years old, get at least 1,000 mg of calcium and at least 600 international units (IU)  of vitamin D per day. If you are older than age 46 but younger than age 63, get at least 1,200 mg of calcium and at least 600  international units (IU) of vitamin D per day. If you are older than age 14, get at least 1,200 mg of calcium and at least 800 international units (IU) of vitamin D per day. Smoking and drinking excessive alcohol increase the risk of osteoporosis. Eat foods that are rich in calcium and vitamin D, and do weight-bearing exercises several times each week as directed by your health care provider. How does menopause affect my mental health? Depression may occur at any age, but it is more common as you become older. Common symptoms of depression include: Feeling depressed. Changes in sleep patterns. Changes in appetite or eating patterns. Feeling an overall lack of motivation or enjoyment of activities that you previously enjoyed. Frequent crying spells. Talk with your health care provider if you think that you are experiencing any of these symptoms. General instructions See your health care provider for regular wellness exams and vaccines. This may include: Scheduling regular health, dental, and eye exams. Getting and maintaining your vaccines. These include: Influenza vaccine. Get this vaccine each year before the flu season begins. Pneumonia vaccine. Shingles vaccine. Tetanus, diphtheria, and pertussis (Tdap) booster vaccine. Your health care provider may also recommend other immunizations. Tell your health care provider if you have ever been abused or do not feel safe at home. Summary Menopause is a normal process in which your ability to get pregnant comes to an end. This condition causes hot flashes, night sweats, decreased interest in sex, mood swings, headaches, or lack of sleep. Treatment for this condition may include hormone replacement therapy. Take actions to keep yourself healthy, including exercising regularly, eating a healthy diet, watching your weight, and checking your blood pressure and blood sugar levels. Get screened for cancer and depression. Make sure that you are up to  date with all your vaccines. This information is not intended to replace advice given to you by your health care provider. Make sure you discuss any questions you have with your health care provider. Document Revised: 11/12/2020 Document Reviewed: 11/12/2020 Elsevier Patient Education  [place].

## 2022-06-10 NOTE — Assessment & Plan Note (Signed)
Calculated ASCVD risk score 3.1%7

## 2022-06-10 NOTE — Progress Notes (Signed)
Subjective:    Patient ID: Tracy Jacobs, female    DOB: [birth year], 51 y.o.   MRN: [id number]  CC: Tracy Jacobs is a 51 y.o. female who presents today for physical exam.    HPI: Feels well today. She is frustrated by weight gain.   She is skipping breakfast or perhaps nab crackers. Lunch varies and may be ham sandwich, chips.  Supper is variable may be grilled chicken and steak.   She doesn't drink sweet drinks.  No personal or family history of thyroid cancer, endocrine cancer  BP at home can vary from 118/80-130/80    Colorectal Cancer Screening: due Breast Cancer Screening: due Cervical Cancer Screening: History of hysterectomy NO CERVIX on exam 04/25/2020 nor endocervical cells on Pap 04/25/20 .          Tetanus - UTD        Labs: Screening labs today. Exercise: No regular exercise.   Alcohol use:  none Smoking/tobacco use: Nonsmoker.     HISTORY:  Past Medical History:  Diagnosis Date   Chicken pox    Kidney stones     Past Surgical History:  Procedure Laterality Date   ABDOMINAL HYSTERECTOMY     NO CERVIX on exam 04/25/2020 nor endocervical cells on Pap 04/25/20   CESAREAN SECTION     3 times   Family History  Problem Relation Age of Onset   Alcohol abuse Mother    Alcohol abuse Father    Breast cancer Sister 42   Colon cancer Paternal Aunt 71   Thyroid cancer Neg Hx       ALLERGIES: Pollen extract  No current outpatient medications on file prior to visit.   No current facility-administered medications on file prior to visit.    Social History   Tobacco Use   Smoking status: Never   Smokeless tobacco: Never  Substance Use Topics   Alcohol use: No   Drug use: No    Review of Systems  Constitutional:  Negative for chills, fever and unexpected weight change.  HENT:  Negative for congestion.   Respiratory:  Negative for cough.   Cardiovascular:  Negative for chest pain, palpitations and leg swelling.  Gastrointestinal:   Negative for nausea and vomiting.  Musculoskeletal:  Negative for arthralgias and myalgias.  Skin:  Negative for rash.  Neurological:  Negative for headaches.  Hematological:  Negative for adenopathy.  Psychiatric/Behavioral:  Negative for confusion.       Objective:    BP 136/82   Pulse 77   Temp 97.9 F (36.6 C) (Oral)   Ht '5\' 3"'$  (1.6 m)   Wt 243 lb 3.2 oz (110.3 kg)   SpO2 96%   BMI 43.08 kg/m   BP Readings from Last 3 Encounters:  06/10/22 136/82  04/24/21 (!) 124/95  04/25/20 116/66   Wt Readings from Last 3 Encounters:  06/10/22 243 lb 3.2 oz (110.3 kg)  04/24/21 237 lb 12.8 oz (107.9 kg)  04/25/20 232 lb 6.4 oz (105.4 kg)    Physical Exam Vitals reviewed.  Constitutional:      Appearance: Normal appearance. She is well-developed.  Eyes:     Conjunctiva/sclera: Conjunctivae normal.  Neck:     Thyroid: No thyroid mass or thyromegaly.  Cardiovascular:     Rate and Rhythm: Normal rate and regular rhythm.     Pulses: Normal pulses.     Heart sounds: Normal heart sounds.  Pulmonary:     Effort: Pulmonary effort is normal.  Breath sounds: Normal breath sounds. No wheezing, rhonchi or rales.  Chest:  Breasts:    Breasts are symmetrical.     Right: No inverted nipple, mass, nipple discharge, skin change or tenderness.     Left: No inverted nipple, mass, nipple discharge, skin change or tenderness.  Abdominal:     General: Bowel sounds are normal. There is no distension.     Palpations: Abdomen is soft. Abdomen is not rigid. There is no fluid wave or mass.     Tenderness: There is no abdominal tenderness. There is no guarding or rebound.  Lymphadenopathy:     Head:     Right side of head: No submental, submandibular, tonsillar, preauricular, posterior auricular or occipital adenopathy.     Left side of head: No submental, submandibular, tonsillar, preauricular, posterior auricular or occipital adenopathy.     Cervical: No cervical adenopathy.     Right  cervical: No superficial, deep or posterior cervical adenopathy.    Left cervical: No superficial, deep or posterior cervical adenopathy.  Skin:    General: Skin is warm and dry.  Neurological:     Mental Status: She is alert.  Psychiatric:        Speech: Speech normal.        Behavior: Behavior normal.        Thought Content: Thought content normal.        Assessment & Plan:   Problem List Items Addressed This Visit       Other   Elevated blood pressure reading    Elevated today.  Counseled on low-sodium diet.  Patient will continue to monitor.  We are aggressively working on weight loss to see blood pressure approaches 120 /80.         HLD (hyperlipidemia)    Calculated ASCVD risk score 3.1%7      Relevant Orders   Comprehensive metabolic panel   Obesity    No history of prediabetes.  Discussed low glycemic diet.  Patient agreeable to trial of Wegovy if insurance will approve.  Counseled patient on blackbox warning as it relates to medullary thyroid cancer, multiple endocrine neoplasia.  Counseled patient on mechanism of action and side effects of Wegovy.  Close follow-up      Relevant Medications   Semaglutide-Weight Management (WEGOVY) 0.25 MG/0.5ML SOAJ   Other Relevant Orders   TSH   Routine physical examination - Primary    Clinical breast exam performed today.  Deferred pelvic exam in absence of complaints and patient does not have a cervix as demonstrated on Pap smear 04/26/2020.  Patient will schedule mammogram.  Colonoscopy referred      Relevant Orders   Ambulatory referral to Gastroenterology   MM 3D SCREEN BREAST BILATERAL   Other Visit Diagnoses     Vitamin D deficiency       Relevant Orders   VITAMIN D 25 Hydroxy (Vit-D Deficiency, Fractures)        I am having Tracy L. Olena Heckle "Lea" start on Wegovy.   Meds ordered this encounter  Medications   Semaglutide-Weight Management (WEGOVY) 0.25 MG/0.5ML SOAJ    Sig: Inject 0.25 mg into the  skin once a week.    Dispense:  2 mL    Refill:  2    Order Specific Question:   Supervising Provider    Answer:   Crecencio Mc [[id number]]    Return precautions given.   Risks, benefits, and alternatives of the medications and treatment plan prescribed today were discussed,  and patient expressed understanding.   Education regarding symptom management and diagnosis given to patient on AVS.   Continue to follow with Burnard Hawthorne, FNP for routine health maintenance.   Tracy Jacobs and I agreed with plan.   Mable Paris, FNP

## 2022-06-11 ENCOUNTER — Other Ambulatory Visit: Payer: Self-pay | Admitting: Family

## 2022-06-11 DIAGNOSIS — E559 Vitamin D deficiency, unspecified: Secondary | ICD-10-CM

## 2022-06-11 DIAGNOSIS — R899 Unspecified abnormal finding in specimens from other organs, systems and tissues: Secondary | ICD-10-CM

## 2022-06-11 LAB — COMPREHENSIVE METABOLIC PANEL
ALT: 50 IU/L — ABNORMAL HIGH (ref 0–32)
AST: 30 IU/L (ref 0–40)
Albumin/Globulin Ratio: 2.2 (ref 1.2–2.2)
Albumin: 4.8 g/dL (ref 3.8–4.9)
Alkaline Phosphatase: 109 IU/L (ref 44–121)
BUN/Creatinine Ratio: 14 (ref 9–23)
BUN: 12 mg/dL (ref 6–24)
Bilirubin Total: 0.4 mg/dL (ref 0.0–1.2)
CO2: 22 mmol/L (ref 20–29)
Calcium: 9.4 mg/dL (ref 8.7–10.2)
Chloride: 105 mmol/L (ref 96–106)
Creatinine, Ser: 0.85 mg/dL (ref 0.57–1.00)
Globulin, Total: 2.2 g/dL (ref 1.5–4.5)
Glucose: 89 mg/dL (ref 70–99)
Potassium: 4.3 mmol/L (ref 3.5–5.2)
Sodium: 141 mmol/L (ref 134–144)
Total Protein: 7 g/dL (ref 6.0–8.5)
eGFR: 82 mL/min/{1.73_m2} (ref >59)

## 2022-06-11 LAB — SPECIMEN STATUS REPORT

## 2022-06-11 LAB — TSH: TSH: 1.35 u[IU]/mL (ref 0.450–4.500)

## 2022-06-11 LAB — VITAMIN D 25 HYDROXY (VIT D DEFICIENCY, FRACTURES): Vit D, 25-Hydroxy: 12.5 ng/mL — ABNORMAL LOW (ref 30.0–100.0)

## 2022-06-11 MED ORDER — CHOLECALCIFEROL 1.25 MG (50000 UT) PO TABS: ORAL_TABLET | ORAL | 0 refills | Status: DC

## 2022-06-24 ENCOUNTER — Ambulatory Visit
Admission: RE | Admit: 2022-06-24 | Discharge: 2022-06-24 | Disposition: A | Discharge status: Home / Self Care | Payer: Managed Care, Other (non HMO) | Source: Ambulatory Visit | Attending: Family | Admitting: Family

## 2022-06-24 DIAGNOSIS — Z0000 Encounter for general adult medical examination without abnormal findings: Secondary | ICD-10-CM

## 2022-06-24 DIAGNOSIS — Z1231 Encounter for screening mammogram for malignant neoplasm of breast: Secondary | ICD-10-CM | POA: Insufficient documentation

## 2022-07-22 ENCOUNTER — Encounter: Payer: Self-pay | Admitting: Family

## 2022-07-22 ENCOUNTER — Ambulatory Visit (INDEPENDENT_AMBULATORY_CARE_PROVIDER_SITE_OTHER): Payer: Managed Care, Other (non HMO) | Admitting: Family

## 2022-07-22 VITALS — BP 124/78 | HR 82 | Temp 98.1°F | Ht 63.0 in | Wt 246.0 lb

## 2022-07-22 DIAGNOSIS — E669 Obesity, unspecified: Secondary | ICD-10-CM

## 2022-07-22 MED ORDER — METFORMIN HCL ER 500 MG PO TB24: 1000.0000 mg | ORAL_TABLET | Freq: Two times a day (BID) | ORAL | 3 refills | Status: DC

## 2022-07-22 NOTE — Assessment & Plan Note (Signed)
Unfortunately Tracy Jacobs was not approved by insurance.  Patient and I discussed metformin, side effects, titration and mechanism of action.  Trial of metformin XR 500 mg, titrated up to 2000 mg/ qd

## 2022-07-22 NOTE — Patient Instructions (Signed)
Metformin is used in prediabetes, diabetes, and also for weight loss by decreasing calorie consumption.   It works in a couple of ways by decreasing liver glucose production, decreases intestinal absorption of glucose and improves insulin sensitivity (increases peripheral glucose uptake and utilization).     Please make sure that you titrate per below so not to cause any GI upset.  The instructions on the metformin bottle however say metformin 1000mg twice daily so that you do not run out of medication when you are on maximum dose but you will need to titrate to get there.    Lets trial metformin  Start metformin XR with one 500mg tablet at night. After one week, you may increase to two tablets at night ( total of 1000mg) . The third week, you may take take two tablets at night and one tablet in the morning.  The fourth week, you may take two tablets in the morning ( 1000mg total) and two tablets at night (1000mg total). This will bring you to a maximum daily dose of 2000mg/day which is maximum dose.  So you are aware,  you may take ALL 4 tablets of metformin together at the same time if preferable and doesn't cause GI upset. You may take metformin 2000mg ( four of the 500mg tablets) together in the morning or at night if you prefer.   Along the way, if you want to increase more slowly, please do as this medication can cause GI discomfort and loose stools which usually get better with time , however some patients find that they can only tolerate a certain dose and cannot increase to maximum dose.    

## 2022-07-22 NOTE — Progress Notes (Signed)
   Assessment & Plan:  Obesity without serious comorbidity, unspecified classification, unspecified obesity type Assessment & Plan: Unfortunately Tracy Jacobs was not approved by insurance.  Patient and I discussed metformin, side effects, titration and mechanism of action.  Trial of metformin XR 500 mg, titrated up to 2000 mg/ qd  Orders: -     metFORMIN HCl ER; Take 2 tablets (1,000 mg total) by mouth 2 (two) times daily.  Dispense: 360 tablet; Refill: 3     Return precautions given.   Risks, benefits, and alternatives of the medications and treatment plan prescribed today were discussed, and patient expressed understanding.   Education regarding symptom management and diagnosis given to patient on AVS either electronically or printed.  Return for Complete Physical Exam.  Tracy Paris, FNP  Subjective:    Patient ID: Tracy Jacobs, female    DOB: 1970/09/21, 52 y.o.   MRN: [id number]  CC: Tracy Jacobs is a 52 y.o. female who presents today for follow up.   HPI: Feels well today .No new complaints.  Unfortunately Tracy Jacobs was not approved through insurance.  Interested in weight loss medications.  Previously tried Wellbutrin, ineffective in the past.  No history of prediabetes, diabetes.  No family history of diet    Allergies: Pollen extract Current Outpatient Medications on File Prior to Visit  Medication Sig Dispense Refill   Cholecalciferol 1.25 MG (50000 UT) TABS 50,000 units PO qwk for 8 weeks. 8 tablet 0   No current facility-administered medications on file prior to visit.    Review of Systems  Constitutional:  Negative for chills and fever.  Respiratory:  Negative for cough.   Cardiovascular:  Negative for chest pain and palpitations.  Gastrointestinal:  Negative for nausea and vomiting.      Objective:    BP 124/78   Pulse 82   Temp 98.1 F (36.7 C) (Oral)   Ht '5\' 3"'$  (1.6 m)   Wt 246 lb (111.6 kg)   SpO2 96%   BMI 43.58 kg/m  BP Readings from Last 3  Encounters:  07/22/22 124/78  06/10/22 136/82  04/24/21 (!) 124/95   Wt Readings from Last 3 Encounters:  07/22/22 246 lb (111.6 kg)  06/10/22 243 lb 3.2 oz (110.3 kg)  04/24/21 237 lb 12.8 oz (107.9 kg)    Physical Exam Vitals reviewed.  Constitutional:      Appearance: She is well-developed.  Eyes:     Conjunctiva/sclera: Conjunctivae normal.  Cardiovascular:     Rate and Rhythm: Normal rate and regular rhythm.     Pulses: Normal pulses.     Heart sounds: Normal heart sounds.  Pulmonary:     Effort: Pulmonary effort is normal.     Breath sounds: Normal breath sounds. No wheezing, rhonchi or rales.  Skin:    General: Skin is warm and dry.  Neurological:     Mental Status: She is alert.  Psychiatric:        Speech: Speech normal.        Behavior: Behavior normal.        Thought Content: Thought content normal.

## 2023-06-15 ENCOUNTER — Encounter: Payer: Self-pay | Admitting: Family

## 2023-06-15 ENCOUNTER — Ambulatory Visit: Payer: Managed Care, Other (non HMO) | Admitting: Family

## 2023-06-15 VITALS — BP 128/76 | HR 88 | Temp 98.1°F | Ht 63.0 in | Wt 248.2 lb

## 2023-06-15 DIAGNOSIS — Z23 Encounter for immunization: Secondary | ICD-10-CM

## 2023-06-15 DIAGNOSIS — R6889 Other general symptoms and signs: Secondary | ICD-10-CM | POA: Diagnosis not present

## 2023-06-15 DIAGNOSIS — Z1231 Encounter for screening mammogram for malignant neoplasm of breast: Secondary | ICD-10-CM | POA: Diagnosis not present

## 2023-06-15 DIAGNOSIS — Z136 Encounter for screening for cardiovascular disorders: Secondary | ICD-10-CM

## 2023-06-15 DIAGNOSIS — Z0000 Encounter for general adult medical examination without abnormal findings: Secondary | ICD-10-CM

## 2023-06-15 DIAGNOSIS — G4733 Obstructive sleep apnea (adult) (pediatric): Secondary | ICD-10-CM

## 2023-06-15 DIAGNOSIS — Z13220 Encounter for screening for lipoid disorders: Secondary | ICD-10-CM

## 2023-06-15 DIAGNOSIS — E669 Obesity, unspecified: Secondary | ICD-10-CM

## 2023-06-15 MED ORDER — METFORMIN HCL ER 500 MG PO TB24: 500.0000 mg | ORAL_TABLET | Freq: Every evening | ORAL | 3 refills | Status: AC

## 2023-06-15 MED ORDER — BUPROPION HCL ER (XL) 150 MG PO TB24: ORAL_TABLET | ORAL | 3 refills | Status: AC

## 2023-06-15 NOTE — Patient Instructions (Addendum)
Let me know when I can arrange pulmonology consult for sleep apnea  Metformin is used in prediabetes, diabetes, and also for weight loss by decreasing calorie consumption.   It works in a couple of ways by decreasing liver glucose production, decreases intestinal absorption of glucose and improves insulin sensitivity (increases peripheral glucose uptake and utilization).  Please send me result from [facility] Fecal DNA test ( similar to Cologuard).    Please make sure that you titrate per below so not to cause any GI upset.    Start metformin XR with one 500mg  tablet at night. After one week, you may increase to two tablets at night ( total of 1000mg ) . The third week, you may take take two tablets at night and one tablet in the morning.  The fourth week, you may take two tablets in the morning ( 1000mg  total) and two tablets at night (1000mg  total). This will bring you to a maximum daily dose of 2000mg /day which is maximum dose.  So you are aware,  you may take ALL 4 tablets of metformin together at the same time if preferable and doesn't cause GI upset. You may take metformin 2000mg  ( four of the 500mg  tablets) together in the morning or at night if you prefer.   Along the way, if you want to increase more slowly, please do as this medication can cause GI discomfort and loose stools which usually get better with time , however some patients find that they can only tolerate a certain dose and cannot increase to maximum dose.    Start wellbutrin 150mg   Please call  and schedule your 3D mammogram and /or bone density scan as we discussed.   [facility]  ( new location in 2023)  [address], [place], [place] [address]  [place], [place]  [phone]   Health Maintenance for Postmenopausal Women Menopause is a normal process in which your ability to get pregnant comes to an end. This process happens slowly over many months or years, usually between the ages of 43 and 45.  Menopause is complete when you have missed your menstrual period for 12 months. It is important to talk with your health care provider about some of the most common conditions that affect women after menopause (postmenopausal women). These include heart disease, cancer, and bone loss (osteoporosis). Adopting a healthy lifestyle and getting preventive care can help to promote your health and wellness. The actions you take can also lower your chances of developing some of these common conditions. What are the signs and symptoms of menopause? During menopause, you may have the following symptoms: Hot flashes. These can be moderate or severe. Night sweats. Decrease in sex drive. Mood swings. Headaches. Tiredness (fatigue). Irritability. Memory problems. Problems falling asleep or staying asleep. Talk with your health care provider about treatment options for your symptoms. Do I need hormone replacement therapy? Hormone replacement therapy is effective in treating symptoms that are caused by menopause, such as hot flashes and night sweats. Hormone replacement carries certain risks, especially as you become older. If you are thinking about using estrogen or estrogen with progestin, discuss the benefits and risks with your health care provider. How can I reduce my risk for heart disease and stroke? The risk of heart disease, heart attack, and stroke increases as you age. One of the causes may be a change in the body's hormones during menopause. This can affect how your body uses dietary fats, triglycerides, and cholesterol. Heart attack  and stroke are medical emergencies. There are many things that you can do to help prevent heart disease and stroke. Watch your blood pressure High blood pressure causes heart disease and increases the risk of stroke. This is more likely to develop in people who have high blood pressure readings or are overweight. Have your blood pressure checked: Every 3-5 years if you  are 2-30 years of age. Every year if you are 26 years old or older. Eat a healthy diet  Eat a diet that includes plenty of vegetables, fruits, low-fat dairy products, and lean protein. Do not eat a lot of foods that are high in solid fats, added sugars, or sodium. Get regular exercise Get regular exercise. This is one of the most important things you can do for your health. Most adults should: Try to exercise for at least 150 minutes each week. The exercise should increase your heart rate and make you sweat (moderate-intensity exercise). Try to do strengthening exercises at least twice each week. Do these in addition to the moderate-intensity exercise. Spend less time sitting. Even light physical activity can be beneficial. Other tips Work with your health care provider to achieve or maintain a healthy weight. Do not use any products that contain nicotine or tobacco. These products include cigarettes, chewing tobacco, and vaping devices, such as e-cigarettes. If you need help quitting, ask your health care provider. Know your numbers. Ask your health care provider to check your cholesterol and your blood sugar (glucose). Continue to have your blood tested as directed by your health care provider. Do I need screening for cancer? Depending on your health history and family history, you may need to have cancer screenings at different stages of your life. This may include screening for: Breast cancer. Cervical cancer. Lung cancer. Colorectal cancer. What is my risk for osteoporosis? After menopause, you may be at increased risk for osteoporosis. Osteoporosis is a condition in which bone destruction happens more quickly than new bone creation. To help prevent osteoporosis or the bone fractures that can happen because of osteoporosis, you may take the following actions: If you are 76-63 years old, get at least 1,000 mg of calcium and at least 600 international units (IU) of vitamin D per day. If  you are older than age 28 but younger than age 50, get at least 1,200 mg of calcium and at least 600 international units (IU) of vitamin D per day. If you are older than age 42, get at least 1,200 mg of calcium and at least 800 international units (IU) of vitamin D per day. Smoking and drinking excessive alcohol increase the risk of osteoporosis. Eat foods that are rich in calcium and vitamin D, and do weight-bearing exercises several times each week as directed by your health care provider. How does menopause affect my mental health? Depression may occur at any age, but it is more common as you become older. Common symptoms of depression include: Feeling depressed. Changes in sleep patterns. Changes in appetite or eating patterns. Feeling an overall lack of motivation or enjoyment of activities that you previously enjoyed. Frequent crying spells. Talk with your health care provider if you think that you are experiencing any of these symptoms. General instructions See your health care provider for regular wellness exams and vaccines. This may include: Scheduling regular health, dental, and eye exams. Getting and maintaining your vaccines. These include: Influenza vaccine. Get this vaccine each year before the flu season begins. Pneumonia vaccine. Shingles vaccine. Tetanus, diphtheria, and pertussis (  Tdap) booster vaccine. Your health care provider may also recommend other immunizations. Tell your health care provider if you have ever been abused or do not feel safe at home. Summary Menopause is a normal process in which your ability to get pregnant comes to an end. This condition causes hot flashes, night sweats, decreased interest in sex, mood swings, headaches, or lack of sleep. Treatment for this condition may include hormone replacement therapy. Take actions to keep yourself healthy, including exercising regularly, eating a healthy diet, watching your weight, and checking your blood  pressure and blood sugar levels. Get screened for cancer and depression. Make sure that you are up to date with all your vaccines. This information is not intended to replace advice given to you by your health care provider. Make sure you discuss any questions you have with your health care provider. Document Revised: 11/12/2020 Document Reviewed: 11/12/2020 Elsevier Patient Education  2024 [employer].

## 2023-06-15 NOTE — Assessment & Plan Note (Signed)
Clinical breast exam performed today.  Deferred pelvic exam in the absence of complaints the patient does not have a cervix.  She does not want further cervical cancer screening.  She will obtain fecal DNA test through her work, [employer].  Patient will schedule mammogram

## 2023-06-15 NOTE — Progress Notes (Signed)
Assessment & Plan:  Routine physical examination Assessment & Plan: Clinical breast exam performed today.  Deferred pelvic exam in the absence of complaints the patient does not have a cervix.  She does not want further cervical cancer screening.  She will obtain fecal DNA test through her work, [employer].  Patient will schedule mammogram  Orders: -     Lipid panel -     VITAMIN D 25 Hydroxy (Vit-D Deficiency, Fractures) -     Insulin, random  Encounter for screening mammogram for malignant neoplasm of breast -     3D Screening Mammogram, Left and Right; Future  Obesity without serious comorbidity, unspecified class, unspecified obesity type Assessment & Plan: Counseled on side effects and titration of metformin.  Restart metformin 500 mg every day.  We also agreed to start Wellbutrin 150mg  to treat apathy, address weight gain.  Counseled on side effects of Wellbutrin including irritability, anxiety.  Patient will let me know how she is doing.  Orders: -     buPROPion HCl ER (XL); Start 150 mg ER PO qam, increase after 1 month to 300 mg qam.  Dispense: 180 tablet; Refill: 3 -     metFORMIN HCl ER; Take 1 tablet (500 mg total) by mouth every evening.  Dispense: 90 tablet; Refill: 3 -     TSH -     CBC with Differential/Platelet -     Comprehensive metabolic panel -     Hemoglobin A1c -     Insulin, random  OSA (obstructive sleep apnea) Assessment & Plan: Chronic fatigue.  Expressed the importance of treating OSA due to risk of cardiac arrhythmia, stroke, chronic fatigue.  Patient will discuss with insurance and let me know if I can place referral for reevaluation   Encounter for lipid screening for cardiovascular disease -     Lipid panel  Heat intolerance Assessment & Plan: Etiology unclear at this time.  Pending labs.  Question if weight gain , postmenopausal state contributory  Orders: -     Follicle stimulating hormone     Return precautions given.   Risks, benefits,  and alternatives of the medications and treatment plan prescribed today were discussed, and patient expressed understanding.   Education regarding symptom management and diagnosis given to patient on AVS either electronically or printed.  Return in about 6 weeks (around 07/27/2023).  Rennie Plowman, FNP  Subjective:    Patient ID: Tracy Jacobs, female    DOB: [birth year], 52 y.o.   MRN: [id number]  CC: Tracy Jacobs is a 52 y.o. female who presents today for physical exam.    HPI: She complains of hot intolerance and sweating throughout the day 1 year, most every day.   Less noticeable in the cooler months.   Sheets are not drenched in sweat. Denies unusual bone pain, weight loss, chills or fever.    She also complains of waking herself up snoring.  History of sleep apnea diagnosed in 2018.  Insurance did not cover the cost of CPAP.  Denies headaches.  Endorses chronic fatigue  She endorses decreased motivation, apathy. Previously been on Wellbutrin and had done quite well on medication.  Denies history of seizure, alcohol use, anorexia or bulminia   She is concerned with weight gain.  She never started metformin.  Insurance did not cover [id number]   Colorectal Cancer Screening: due; she is interested in cologuard Breast Cancer Screening: Mammogram due Cervical Cancer Screening: No h/o gyn cancer. Menorrhagia. No cervix on exam  04/25/2020 nor endocervical cells on Pap 04/25/20  Bone Health screening/DEXA for 65+: No increased fracture risk. Defer screening at this time        Tetanus - UTD         Exercise: No regular exercise.   Alcohol use:  none Smoking/tobacco use: Nonsmoker.    Health Maintenance  Topic Date Due   Colon Cancer Screening  Never done   Flu Shot  02/05/2023   COVID-19 Vaccine (3 - 2023-24 season) 03/08/2023   Zoster (Shingles) Vaccine (1 of 2) 09/13/2023*   Mammogram  06/24/2024   Pap with HPV screening  04/25/2025   DTaP/Tdap/Td vaccine (2 - Td or  Tdap) 04/25/2030   Hepatitis C Screening  Completed   HIV Screening  Completed   HPV Vaccine  Aged Out  *Topic was postponed. The date shown is not the original due date.    ALLERGIES: Pollen extract  Current Outpatient Medications on File Prior to Visit  Medication Sig Dispense Refill   Cholecalciferol 1.25 MG (50000 UT) TABS 50,000 units PO qwk for 8 weeks. (Patient not taking: Reported on 06/15/2023) 8 tablet 0   No current facility-administered medications on file prior to visit.    Review of Systems  Constitutional:  Positive for diaphoresis and fatigue. Negative for activity change, appetite change, chills, fever and unexpected weight change.  Respiratory:  Negative for cough.   Cardiovascular:  Negative for chest pain and palpitations.  Gastrointestinal:  Negative for nausea and vomiting.  Endocrine: Positive for heat intolerance. Negative for cold intolerance.  Musculoskeletal:  Negative for arthralgias.  Psychiatric/Behavioral:  Negative for sleep disturbance and suicidal ideas.       Objective:    BP 128/76   Pulse 88   Temp 98.1 F (36.7 C) (Oral)   Ht 5\' 3"  (1.6 m)   Wt 248 lb 3.2 oz (112.6 kg)   SpO2 96%   BMI 43.97 kg/m   BP Readings from Last 3 Encounters:  06/15/23 128/76  07/22/22 124/78  06/10/22 136/82   Wt Readings from Last 3 Encounters:  06/15/23 248 lb 3.2 oz (112.6 kg)  07/22/22 246 lb (111.6 kg)  06/10/22 243 lb 3.2 oz (110.3 kg)      06/15/2023    8:37 AM 07/22/2022    8:37 AM 06/10/2022    9:11 AM  Depression screen PHQ 2/9  Decreased Interest 0 0 0  Down, Depressed, Hopeless 0 0 0  PHQ - 2 Score 0 0 0  Altered sleeping 0 0 0  Tired, decreased energy 0 1 0  Change in appetite 0 1 0  Feeling bad or failure about yourself  0 0 0  Trouble concentrating 0 0 0  Moving slowly or fidgety/restless 0 0 0  Suicidal thoughts 0 0 0  PHQ-9 Score 0 2 0  Difficult doing work/chores Not difficult at all Not difficult at all Not difficult at all      Physical Exam Vitals reviewed.  Constitutional:      Appearance: Normal appearance. She is well-developed.  Eyes:     Conjunctiva/sclera: Conjunctivae normal.  Neck:     Thyroid: No thyroid mass or thyromegaly.  Cardiovascular:     Rate and Rhythm: Normal rate and regular rhythm.     Pulses: Normal pulses.     Heart sounds: Normal heart sounds.  Pulmonary:     Effort: Pulmonary effort is normal.     Breath sounds: Normal breath sounds. No wheezing, rhonchi or rales.  Chest:  Breasts:    Breasts are symmetrical.     Right: No inverted nipple, mass, nipple discharge, skin change or tenderness.     Left: No inverted nipple, mass, nipple discharge, skin change or tenderness.  Abdominal:     General: Bowel sounds are normal. There is no distension.     Palpations: Abdomen is soft. Abdomen is not rigid. There is no fluid wave or mass.     Tenderness: There is no abdominal tenderness. There is no guarding or rebound.  Lymphadenopathy:     Head:     Right side of head: No submental, submandibular, tonsillar, preauricular, posterior auricular or occipital adenopathy.     Left side of head: No submental, submandibular, tonsillar, preauricular, posterior auricular or occipital adenopathy.     Cervical: No cervical adenopathy.     Right cervical: No superficial, deep or posterior cervical adenopathy.    Left cervical: No superficial, deep or posterior cervical adenopathy.  Skin:    General: Skin is warm and dry.  Neurological:     Mental Status: She is alert.  Psychiatric:        Speech: Speech normal.        Behavior: Behavior normal.        Thought Content: Thought content normal.

## 2023-06-15 NOTE — Assessment & Plan Note (Signed)
Chronic fatigue.  Expressed the importance of treating OSA due to risk of cardiac arrhythmia, stroke, chronic fatigue.  Patient will discuss with insurance and let me know if I can place referral for reevaluation

## 2023-06-15 NOTE — Assessment & Plan Note (Signed)
Etiology unclear at this time.  Pending labs.  Question if weight gain , postmenopausal state contributory

## 2023-06-15 NOTE — Addendum Note (Signed)
Addended by: [place], Elisandro Jarrett on: 06/15/2023 09:24 AM   Modules accepted: Orders

## 2023-06-15 NOTE — Assessment & Plan Note (Addendum)
Counseled on side effects and titration of metformin.  Restart metformin 500 mg every day.  We also agreed to start Wellbutrin 150mg  to treat apathy, address weight gain.  Counseled on side effects of Wellbutrin including irritability, anxiety.  Patient will let me know how she is doing.

## 2023-06-16 LAB — VITAMIN D 25 HYDROXY (VIT D DEFICIENCY, FRACTURES): Vit D, 25-Hydroxy: 8.3 ng/mL — ABNORMAL LOW (ref 30.0–100.0)

## 2023-06-16 LAB — HEMOGLOBIN A1C
Est. average glucose Bld gHb Est-mCnc: 117 mg/dL
Hgb A1c MFr Bld: 5.7 % — ABNORMAL HIGH (ref 4.8–5.6)

## 2023-06-16 LAB — COMPREHENSIVE METABOLIC PANEL
ALT: 37 [IU]/L — ABNORMAL HIGH (ref 0–32)
AST: 23 [IU]/L (ref 0–40)
Albumin: 4.5 g/dL (ref 3.8–4.9)
Alkaline Phosphatase: 122 [IU]/L — ABNORMAL HIGH (ref 44–121)
BUN/Creatinine Ratio: 13 (ref 9–23)
BUN: 11 mg/dL (ref 6–24)
Bilirubin Total: 0.4 mg/dL (ref 0.0–1.2)
CO2: 25 mmol/L (ref 20–29)
Calcium: 9.1 mg/dL (ref 8.7–10.2)
Chloride: 104 mmol/L (ref 96–106)
Creatinine, Ser: 0.83 mg/dL (ref 0.57–1.00)
Globulin, Total: 2.4 g/dL (ref 1.5–4.5)
Glucose: 86 mg/dL (ref 70–99)
Potassium: 4.4 mmol/L (ref 3.5–5.2)
Sodium: 143 mmol/L (ref 134–144)
Total Protein: 6.9 g/dL (ref 6.0–8.5)
eGFR: 84 mL/min/{1.73_m2} (ref >59)

## 2023-06-16 LAB — CBC WITH DIFFERENTIAL/PLATELET
Basophils Absolute: 0 10*3/uL (ref 0.0–0.2)
Basos: 0 %
EOS (ABSOLUTE): 0.2 10*3/uL (ref 0.0–0.4)
Eos: 2 %
Hematocrit: 48.7 % — ABNORMAL HIGH (ref 34.0–46.6)
Hemoglobin: 15.9 g/dL (ref 11.1–15.9)
Immature Grans (Abs): 0 10*3/uL (ref 0.0–0.1)
Immature Granulocytes: 0 %
Lymphocytes Absolute: 2.9 10*3/uL (ref 0.7–3.1)
Lymphs: 35 %
MCH: 30.9 pg (ref 26.6–33.0)
MCHC: 32.6 g/dL (ref 31.5–35.7)
MCV: 95 fL (ref 79–97)
Monocytes Absolute: 0.5 10*3/uL (ref 0.1–0.9)
Monocytes: 6 %
Neutrophils Absolute: 4.7 10*3/uL (ref 1.4–7.0)
Neutrophils: 57 %
Platelets: 228 10*3/uL (ref 150–450)
RBC: 5.15 x10E6/uL (ref 3.77–5.28)
RDW: 12.2 % (ref 11.7–15.4)
WBC: 8.3 10*3/uL (ref 3.4–10.8)

## 2023-06-16 LAB — LIPID PANEL
Chol/HDL Ratio: 5.6 {ratio} — ABNORMAL HIGH (ref 0.0–4.4)
Cholesterol, Total: 191 mg/dL (ref 100–199)
HDL: 34 mg/dL — ABNORMAL LOW (ref >39)
LDL Chol Calc (NIH): 123 mg/dL — ABNORMAL HIGH (ref 0–99)
Triglycerides: 188 mg/dL — ABNORMAL HIGH (ref 0–149)
VLDL Cholesterol Cal: 34 mg/dL (ref 5–40)

## 2023-06-16 LAB — FOLLICLE STIMULATING HORMONE: FSH: 44.2 m[IU]/mL

## 2023-06-16 LAB — TSH: TSH: 2.09 u[IU]/mL (ref 0.450–4.500)

## 2023-06-16 LAB — INSULIN, RANDOM: INSULIN: 24.3 u[IU]/mL (ref 2.6–24.9)

## 2023-06-17 ENCOUNTER — Other Ambulatory Visit: Payer: Self-pay | Admitting: Family

## 2023-06-17 DIAGNOSIS — R899 Unspecified abnormal finding in specimens from other organs, systems and tissues: Secondary | ICD-10-CM

## 2023-06-17 DIAGNOSIS — E559 Vitamin D deficiency, unspecified: Secondary | ICD-10-CM

## 2023-06-17 MED ORDER — CHOLECALCIFEROL 1.25 MG (50000 UT) PO TABS: ORAL_TABLET | ORAL | 0 refills | Status: AC

## 2023-07-14 ENCOUNTER — Encounter: Payer: Self-pay | Admitting: Family

## 2023-07-14 ENCOUNTER — Telehealth: Payer: Self-pay

## 2023-07-14 NOTE — Telephone Encounter (Signed)
 I left a message for patient on both her mobile and home numbers asking her to please call us  to reschedule her 07/27/2023 appointment with Rollene Northern, FNP.  I also sent letter to patient via MyChart.  When patient calls back, we need to reschedule her appointment with Rollene Northern, FNP.

## 2023-07-27 ENCOUNTER — Telehealth: Payer: Managed Care, Other (non HMO) | Admitting: Family

## 2023-08-03 ENCOUNTER — Telehealth: Payer: Managed Care, Other (non HMO) | Admitting: Family

## 2023-08-03 ENCOUNTER — Encounter: Payer: Self-pay | Admitting: Family

## 2023-08-03 VITALS — Ht 63.0 in | Wt 248.3 lb

## 2023-08-03 DIAGNOSIS — R899 Unspecified abnormal finding in specimens from other organs, systems and tissues: Secondary | ICD-10-CM

## 2023-08-03 DIAGNOSIS — Z6841 Body Mass Index (BMI) 40.0 and over, adult: Secondary | ICD-10-CM | POA: Diagnosis not present

## 2023-08-03 DIAGNOSIS — E669 Obesity, unspecified: Secondary | ICD-10-CM

## 2023-08-03 NOTE — Progress Notes (Signed)
Virtual Visit via Video Note  I connected with Tracy Jacobs on 08/03/23 at 10:00 AM EST by a video enabled telemedicine application and verified that I am speaking with the correct person using two identifiers. Location patient: home Location provider: work  Persons participating in the virtual visit: patient, provider  I discussed the limitations of evaluation and management by telemedicine and the availability of in person appointments. The patient expressed understanding and agreed to proceed.  HPI: Feels well today  She is tolerating metformin 500mg  qam, 1000mg  qpm. Appetite has decreased somewhat.   Compliant with wellbutrin 300mg  qam.  She has not overly feel overly anxious.   Due colon cancer screening.  She was planning to do fecal DNA test through [facility] which she submitted last week.    ROS: See pertinent positives and negatives per HPI.  EXAM:  VITALS per patient if applicable: Ht 5\' 3"  (1.6 m)   Wt 248 lb 4.8 oz (112.6 kg)   BMI 43.98 kg/m  BP Readings from Last 3 Encounters:  06/15/23 128/76  07/22/22 124/78  06/10/22 136/82   Wt Readings from Last 3 Encounters:  08/03/23 248 lb 4.8 oz (112.6 kg)  06/15/23 248 lb 3.2 oz (112.6 kg)  07/22/22 246 lb (111.6 kg)    GENERAL: alert, oriented, appears well and in no acute distress  HEENT: atraumatic, conjunttiva clear, no obvious abnormalities on inspection of external nose and ears  NECK: normal movements of the head and neck  LUNGS: on inspection no signs of respiratory distress, breathing rate appears normal, no obvious gross SOB, gasping or wheezing  CV: no obvious cyanosis  MS: moves all visible extremities without noticeable abnormality  PSYCH/NEURO: pleasant and cooperative, no obvious depression or anxiety, speech and thought processing grossly intact  ASSESSMENT AND PLAN: Abnormal laboratory test -     Gamma GT -     Alkaline phosphatase, isoenzymes -     Hepatic function panel  Obesity  without serious comorbidity, unspecified class, unspecified obesity type Assessment & Plan: Chronic, without significant change.  Tolerating medication.  Continue metformin 500mg  qam and 1000mg  qpm and wellbutrin 300mg  qam.       -we discussed possible serious and likely etiologies, options for evaluation and workup, limitations of telemedicine visit vs in person visit, treatment, treatment risks and precautions. Pt prefers to treat via telemedicine empirically rather then risking or undertaking an in person visit at this moment.    I discussed the assessment and treatment plan with the patient. The patient was provided an opportunity to ask questions and all were answered. The patient agreed with the plan and demonstrated an understanding of the instructions.   The patient was advised to call back or seek an in-person evaluation if the symptoms worsen or if the condition fails to improve as anticipated.  Advised if desired AVS can be mailed or viewed via MyChart if Mychart user.   Rennie Plowman, FNP

## 2023-08-03 NOTE — Assessment & Plan Note (Signed)
Chronic, without significant change.  Tolerating medication.  Continue metformin 500mg  qam and 1000mg  qpm and wellbutrin 300mg  qam.

## 2023-08-03 NOTE — Patient Instructions (Signed)
Please have fasting labs done with [facility].

## 2023-08-06 ENCOUNTER — Ambulatory Visit
Admission: RE | Admit: 2023-08-06 | Discharge: 2023-08-06 | Disposition: A | Discharge status: Home / Self Care | Payer: Managed Care, Other (non HMO) | Source: Ambulatory Visit | Attending: Family | Admitting: Family

## 2023-08-06 DIAGNOSIS — Z1231 Encounter for screening mammogram for malignant neoplasm of breast: Secondary | ICD-10-CM | POA: Diagnosis present

## 2023-08-29 ENCOUNTER — Other Ambulatory Visit: Payer: Self-pay | Admitting: Family
